# Patient Record
Sex: Female | Born: 1995 | Race: White | Hispanic: No | Marital: Single | Smoking: Never smoker
Health system: Southern US, Community
[De-identification: ages and names within clinical notes are randomized; demographics above are authoritative.]

## PROBLEM LIST (undated history)

## (undated) ENCOUNTER — Emergency Department (HOSPITAL_COMMUNITY): Payer: BC Managed Care – PPO

## (undated) DIAGNOSIS — F32A Depression, unspecified: Secondary | ICD-10-CM

## (undated) DIAGNOSIS — B001 Herpesviral vesicular dermatitis: Secondary | ICD-10-CM

## (undated) DIAGNOSIS — F419 Anxiety disorder, unspecified: Secondary | ICD-10-CM

## (undated) HISTORY — DX: Herpesviral vesicular dermatitis: B00.1

---
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
## 2020-01-14 NOTE — L&D Delivery Note (Addendum)
Delivery Note Shortly after receiving her 2nd epidural, she was noted to be C/C/+3, not feeling any pressure. At 9:56 PM a viable female was delivered via Vaginal, Spontaneous (Presentation: Right Occiput Anterior, loose nuchal, reduced).  APGAR: 9, 9; weight pending. After 1 minute, the cord was clamped and cut. 40 units of pitocin diluted in 1000cc LR was infused rapidly IV.  The placenta separated spontaneously and delivered via CCT and maternal pushing effort.  It was inspected and appears to be intact with a 3 VC.  Anesthesia: Epidural Episiotomy: None Lacerations: None Suture Repair:  Est. Blood Loss (mL):  100  Mom to postpartum.  Baby to Couplet care / Skin to Skin.  Jacklyn Shell 12/13/2020, 10:32 PM

## 2020-05-17 ENCOUNTER — Other Ambulatory Visit: Payer: Self-pay

## 2020-05-17 ENCOUNTER — Ambulatory Visit (INDEPENDENT_AMBULATORY_CARE_PROVIDER_SITE_OTHER): Payer: BC Managed Care – PPO

## 2020-05-17 VITALS — BP 136/78 | HR 72

## 2020-05-17 DIAGNOSIS — Z3201 Encounter for pregnancy test, result positive: Secondary | ICD-10-CM

## 2020-05-17 DIAGNOSIS — Z3200 Encounter for pregnancy test, result unknown: Secondary | ICD-10-CM

## 2020-05-17 LAB — POCT URINE PREGNANCY: Preg Test, Ur: POSITIVE — AB

## 2020-05-17 NOTE — Progress Notes (Signed)
[name] presents today for UPT. She does not complain of any   unusual complaints. LMP:03/13/2020    OBJECTIVE: Appears well, in no apparent distress.  OB History   No obstetric history on file.    Home UPT Result: positive  In-Office UPT result:positive  I have reviewed the patient's medical, obstetrical, social, and family histories, and medications.   ASSESSMENT: Positive pregnancy test  PLAN Prenatal care to be completed at: Follow up with [facility] for prenatal care.

## 2020-06-06 ENCOUNTER — Other Ambulatory Visit: Payer: Self-pay

## 2020-06-06 ENCOUNTER — Ambulatory Visit (INDEPENDENT_AMBULATORY_CARE_PROVIDER_SITE_OTHER): Payer: BC Managed Care – PPO

## 2020-06-06 VITALS — BP 130/85 | HR 89 | Ht 64.0 in | Wt 146.6 lb

## 2020-06-06 DIAGNOSIS — Z3481 Encounter for supervision of other normal pregnancy, first trimester: Secondary | ICD-10-CM

## 2020-06-06 DIAGNOSIS — Z3A11 11 weeks gestation of pregnancy: Secondary | ICD-10-CM

## 2020-06-06 DIAGNOSIS — O3680X0 Pregnancy with inconclusive fetal viability, not applicable or unspecified: Secondary | ICD-10-CM

## 2020-06-06 DIAGNOSIS — Z3491 Encounter for supervision of normal pregnancy, unspecified, first trimester: Secondary | ICD-10-CM | POA: Insufficient documentation

## 2020-06-06 DIAGNOSIS — O219 Vomiting of pregnancy, unspecified: Secondary | ICD-10-CM

## 2020-06-06 MED ORDER — BLOOD PRESSURE KIT DEVI: 1.0000 | 0 refills | Status: DC

## 2020-06-06 MED ORDER — DICLEGIS 10-10 MG PO TBEC: 2.0000 | DELAYED_RELEASE_TABLET | Freq: Every day | ORAL | 2 refills | Status: DC

## 2020-06-06 NOTE — Progress Notes (Signed)
New OB Intake  I connected with  Kelsey Dougherty on 06/06/20 at  3:15 PM EDT by in office and verified that I am speaking with the correct person using two identifiers. Nurse is located at [facility] and pt is located at in office.  I discussed the limitations, risks, security and privacy concerns of performing an evaluation and management service by telephone and the availability of in person appointments. I also discussed with the patient that there may be a patient responsible charge related to this service. The patient expressed understanding and agreed to proceed.  I explained I am completing New OB Intake today. We discussed her EDD of 12/21/20 that is based on LMP of 03/16/20. Pt is G2/P1. I reviewed her allergies, medications, Medical/Surgical/OB history, and appropriate screenings. I informed her of [facility] services. Based on history, this is a/an uncomplicated pregnancy.  There are no problems to display for this patient.   Concerns addressed today  Delivery Plans:  Plans to deliver at [facility].   MyChart/Babyscripts MyChart access verified. I explained pt will have some visits in office and some virtually. Babyscripts instructions given and order placed. Patient verifies receipt of registration text/e-mail. Account successfully created and app downloaded.  Blood Pressure Cuff Blood pressure cuff ordered for patient to pick-up from [employer]. Explained after first prenatal appt pt will check weekly and document in Babyscripts.  Anatomy US Explained first scheduled [place] will be around 19 weeks. Dating and Viability Scan performed today  Labs Discussed Avelina Laine genetic screening with patient. Would like both Panorama and Horizon drawn at new OB visit. Routine prenatal labs needed.  Covid Vaccine Patient has not covid vaccine.   Social Determinants of Health . Food Insecurity: Patient denies food insecurity. . WIC Referral: Patient is interested in referral to [facility].  . Transportation:  Patient denies transportation needs. . Childcare: Discussed no children allowed at ultrasound appointments. Offered childcare services; patient declines childcare services at this time.  First visit review I reviewed new OB appt with pt. I explained she will have a pelvic exam, ob bloodwork with genetic screening, and PAP smear. Explained pt will be seen by Venia Carbon at first visit; encounter routed to appropriate provider. Explained that patient will be seen by pregnancy navigator following visit with provider.  Hamilton Capri, RN 06/06/2020  3:10 PM

## 2020-06-06 NOTE — Progress Notes (Signed)
Agree with A & P. 

## 2020-06-12 ENCOUNTER — Other Ambulatory Visit (HOSPITAL_COMMUNITY)
Admission: RE | Admit: 2020-06-12 | Discharge: 2020-06-12 | Disposition: A | Discharge status: Home / Self Care | Payer: BC Managed Care – PPO | Source: Ambulatory Visit | Attending: Obstetrics and Gynecology | Admitting: Obstetrics and Gynecology

## 2020-06-12 ENCOUNTER — Other Ambulatory Visit: Payer: Self-pay

## 2020-06-12 ENCOUNTER — Ambulatory Visit (INDEPENDENT_AMBULATORY_CARE_PROVIDER_SITE_OTHER): Payer: BC Managed Care – PPO | Admitting: Obstetrics and Gynecology

## 2020-06-12 DIAGNOSIS — Z3481 Encounter for supervision of other normal pregnancy, first trimester: Secondary | ICD-10-CM

## 2020-06-12 NOTE — Progress Notes (Signed)
History:   Kelsey Dougherty is a 25 y.o. G2P1001 at 20w4dby LMP being seen today for her first obstetrical visit.  Her obstetrical history is significant for healthy female . Patient does intend to breast feed. Pregnancy history fully reviewed.  [place] and [place] may be moving.   Patient reports no complaints.      HISTORY: OB History  Gravida Para Term Preterm AB Living  '2 1 1 ' 0 0 1  SAB IAB Ectopic Multiple Live Births  0 0 0 0 1    # Outcome Date GA Lbr Len/2nd Weight Sex Delivery Anes PTL Lv  2 Current           1 Term 08/04/18 381w5d F Vag-Spont   LIV    Last pap smear was done 06/12/20 and was collected today.   No past medical history on file. Past Surgical History:  Procedure Laterality Date  . EYE SURGERY Right 2016   x3  . RHINOPLASTY  2016  . WISDOM TOOTH EXTRACTION  2017   Family History  Problem Relation Age of Onset  . Hypertension Mother   . Skin cancer Father    Social History   Tobacco Use  . Smoking status: Never Smoker  . Smokeless tobacco: Never Used  Vaping Use  . Vaping Use: Never used  Substance Use Topics  . Alcohol use: Not Currently    Comment: occ, not since confirmed preg  . Drug use: Not Currently   Not on File Current Outpatient Medications on File Prior to Visit  Medication Sig Dispense Refill  . Prenatal MV & Min w/FA-DHA (PRENATAL GUMMIES PO) Take by mouth.    . Marland Kitchencyclovir (ZOVIRAX) 400 MG tablet Take 400 mg by mouth 3 (three) times daily. (Patient not taking: Reported on 06/12/2020)    . Blood Pressure Monitoring (BLOOD PRESSURE KIT) DEVI 1 kit by Does not apply route once a week. (Patient not taking: Reported on 06/12/2020) 1 each 0  . DICLEGIS 10-10 MG TBEC Take 2 tablets by mouth at bedtime. If symptoms persist, add one tablet in the morning and one in the afternoon (Patient not taking: Reported on 06/12/2020) 100 tablet 2   No current facility-administered medications on  file prior to visit.    Review of Systems Pertinent items noted in HPI and remainder of comprehensive ROS otherwise negative.  Physical Exam:   Vitals:   06/12/20 1348  BP: 124/83  Pulse: 82  Weight: 145 lb 9.6 oz (66 kg)   Fetal Heart Rate (bpm): 162  General: well-developed, well-nourished female in no acute distress  Breasts:  normal appearance, no masses or tenderness bilaterally  Skin: normal coloration and turgor, no rashes  Neurologic: oriented, normal, negative, normal mood  Extremities: normal strength, tone, and muscle mass, ROM of all joints is normal  HEENT PERRLA, extraocular movement intact and sclera clear, anicteric  Neck supple and no masses  Cardiovascular: regular rate and rhythm  Respiratory:  no respiratory distress, normal breath sounds  Abdomen: soft, non-tender; bowel sounds normal; no masses,  no organomegaly  Pelvic: normal external genitalia, no lesions, normal vaginal mucosa, normal vaginal discharge, normal cervix, pap smear done. Uterine size:  deferred     Assessment:    Pregnancy: G2P1001 Patient Active Problem List   Diagnosis Date Noted  . Encounter for supervision of normal pregnancy in first trimester 06/06/2020     Plan:   1. Encounter for supervision  of other normal pregnancy in first trimester  - Cervicovaginal ancillary only( [place]) - Babyscripts Schedule Optimization - Culture, OB Urine - Obstetric Panel, Including HIV - Genetic Screening - Hep C Antibody - Cytology - PAP( [place]) - Hemoglobin A1c   Initial labs drawn. Continue prenatal vitamins. Problem list reviewed and updated. Genetic Screening discussed, NIPS: requested. Ultrasound discussed; fetal anatomic survey: requested. Anticipatory guidance about prenatal visits given including labs, ultrasounds, and testing. Discussed usage of Babyscripts and virtual visits as additional source of managing and completing prenatal visits in midst of coronavirus and  pandemic.   Encouraged to complete MyChart Registration for her ability to review results, send requests, and have questions addressed.  The nature of [place] for [facility]ulty [facility] with multiple MDs and Advanced Practice Providers was explained to patient; also emphasized that residents, students are part of our team. Routine obstetric precautions reviewed. Encouraged to seek out care at office or emergency room [facility] MAU preferred) for urgent and/or emergent concerns. No follow-ups on file.     Chrisa Hassan, Artist Pais, [place] for [employer], [place]

## 2020-06-13 DIAGNOSIS — Z419 Encounter for procedure for purposes other than remedying health state, unspecified: Secondary | ICD-10-CM | POA: Diagnosis not present

## 2020-06-13 LAB — CERVICOVAGINAL ANCILLARY ONLY
Chlamydia: NEGATIVE
Comment: NEGATIVE
Comment: NEGATIVE
Comment: NORMAL
Neisseria Gonorrhea: NEGATIVE
Trichomonas: NEGATIVE

## 2020-06-13 LAB — OBSTETRIC PANEL, INCLUDING HIV
Antibody Screen: NEGATIVE
Basophils Absolute: 0 10*3/uL (ref 0.0–0.2)
Basos: 0 %
EOS (ABSOLUTE): 0.1 10*3/uL (ref 0.0–0.4)
Eos: 2 %
HIV Screen 4th Generation wRfx: NONREACTIVE
Hematocrit: 39.3 % (ref 34.0–46.6)
Hemoglobin: 13 g/dL (ref 11.1–15.9)
Hepatitis B Surface Ag: NEGATIVE
Immature Grans (Abs): 0 10*3/uL (ref 0.0–0.1)
Immature Granulocytes: 0 %
Lymphocytes Absolute: 1.7 10*3/uL (ref 0.7–3.1)
Lymphs: 22 %
MCH: 30 pg (ref 26.6–33.0)
MCHC: 33.1 g/dL (ref 31.5–35.7)
MCV: 91 fL (ref 79–97)
Monocytes Absolute: 0.4 10*3/uL (ref 0.1–0.9)
Monocytes: 6 %
Neutrophils Absolute: 5.1 10*3/uL (ref 1.4–7.0)
Neutrophils: 70 %
Platelets: 210 10*3/uL (ref 150–450)
RBC: 4.33 x10E6/uL (ref 3.77–5.28)
RDW: 12.4 % (ref 11.7–15.4)
RPR Ser Ql: NONREACTIVE
Rh Factor: POSITIVE
Rubella Antibodies, IGG: 2.95 index (ref >0.99)
WBC: 7.4 10*3/uL (ref 3.4–10.8)

## 2020-06-13 LAB — HEPATITIS C ANTIBODY: Hep C Virus Ab: <0.1 s/co ratio (ref 0.0–0.9)

## 2020-06-13 LAB — HEMOGLOBIN A1C
Est. average glucose Bld gHb Est-mCnc: 91 mg/dL
Hgb A1c MFr Bld: 4.8 % (ref 4.8–5.6)

## 2020-06-14 DIAGNOSIS — Z3480 Encounter for supervision of other normal pregnancy, unspecified trimester: Secondary | ICD-10-CM | POA: Diagnosis not present

## 2020-06-14 LAB — URINE CULTURE, OB REFLEX

## 2020-06-14 LAB — CULTURE, OB URINE

## 2020-06-18 ENCOUNTER — Encounter: Payer: Self-pay | Admitting: Obstetrics and Gynecology

## 2020-06-18 LAB — CYTOLOGY - PAP
Comment: NEGATIVE
Comment: NEGATIVE
Diagnosis: NEGATIVE
HPV 16: NEGATIVE
HPV 18 / 45: NEGATIVE
High risk HPV: POSITIVE — AB

## 2020-06-20 ENCOUNTER — Telehealth: Payer: Self-pay

## 2020-06-20 NOTE — Telephone Encounter (Signed)
Patient fetal sex was not check on the natera form.She would like this to be added on the testing.Message sent to [facility] to add on fetal sex.

## 2020-06-21 ENCOUNTER — Other Ambulatory Visit: Payer: Self-pay

## 2020-07-10 ENCOUNTER — Ambulatory Visit (INDEPENDENT_AMBULATORY_CARE_PROVIDER_SITE_OTHER): Payer: BC Managed Care – PPO | Admitting: Advanced Practice Midwife

## 2020-07-10 ENCOUNTER — Other Ambulatory Visit: Payer: Self-pay

## 2020-07-10 VITALS — BP 117/72 | HR 78 | Wt 144.0 lb

## 2020-07-10 DIAGNOSIS — Z3481 Encounter for supervision of other normal pregnancy, first trimester: Secondary | ICD-10-CM

## 2020-07-10 DIAGNOSIS — Z3A16 16 weeks gestation of pregnancy: Secondary | ICD-10-CM

## 2020-07-10 DIAGNOSIS — M792 Neuralgia and neuritis, unspecified: Secondary | ICD-10-CM

## 2020-07-10 NOTE — Progress Notes (Signed)
   PRENATAL VISIT NOTE  Subjective:  Kelsey Dougherty is a 25 y.o. G2P1001 at [redacted]w[redacted]d being seen today for ongoing prenatal care.  She is currently monitored for the following issues for this low-risk pregnancy and has Encounter for supervision of normal pregnancy in first trimester on their problem list.  Patient reports no complaints.  Contractions: Not present. Vag. Bleeding: None.   . Denies leaking of fluid.   The following portions of the patient's history were reviewed and updated as appropriate: allergies, current medications, past family history, past medical history, past social history, past surgical history and problem list.   Objective:   Vitals:   07/10/20 1419  BP: 117/72  Pulse: 78  Weight: 144 lb (65.3 kg)    Fetal Status: Fetal Heart Rate (bpm): 146         General:  Alert, oriented and cooperative. Patient is in no acute distress.  Skin: Skin is warm and dry. No rash noted.   Cardiovascular: Normal heart rate noted  Respiratory: Normal respiratory effort, no problems with respiration noted  Abdomen: Soft, gravid, appropriate for gestational age.  Pain/Pressure: Absent     Pelvic: Cervical exam deferred        Extremities: Normal range of motion.  Edema: None  Mental Status: Normal mood and affect. Normal behavior. Normal judgment and thought content.   Assessment and Plan:  Pregnancy: G2P1001 at [redacted]w[redacted]d 1. Encounter for supervision of other normal pregnancy in first trimester --Anticipatory guidance about next visits/weeks of pregnancy given. --Next appt in 4 weeks --Anatomy US ordered - AFP, Serum, Open Spina Bifida  2. [redacted] weeks gestation of pregnancy   3. Nerve pain --Pt with episodes of pain in her forearms and her feet that are burning and sharp in nature.  They occurred prior to pregnancy but have been worse in the pregnancy. Not c/w sciatica or carpal tunnel. --Pt to f/u with neurology during pregnancy and/or postpartum as needed - Ambulatory referral to  Neurology   Preterm labor symptoms and general obstetric precautions including but not limited to vaginal bleeding, contractions, leaking of fluid and fetal movement were reviewed in detail with the patient. Please refer to After Visit Summary for other counseling recommendations.   Return in about 4 weeks (around 08/07/2020).  Future Appointments  Date Time Provider Department Center  07/27/2020  2:15 PM [facility]2 [facility]  08/06/2020  2:20 PM Leftwich-Kirby, Wilmer Floor, CNM [facility] None    Sharen Counter, CNM

## 2020-07-10 NOTE — Patient Instructions (Signed)

## 2020-07-12 LAB — AFP, SERUM, OPEN SPINA BIFIDA
AFP MoM: 1.24
AFP Value: 48 ng/mL
Gest. Age on Collection Date: 16.6 weeks
Maternal Age At EDD: 23.3 yr
OSBR Risk 1 IN: 5748
Test Results:: NEGATIVE
Weight: 144 [lb_av]

## 2020-07-13 DIAGNOSIS — Z419 Encounter for procedure for purposes other than remedying health state, unspecified: Secondary | ICD-10-CM | POA: Diagnosis not present

## 2020-07-27 ENCOUNTER — Ambulatory Visit: Payer: BC Managed Care – PPO | Attending: Obstetrics and Gynecology

## 2020-07-27 ENCOUNTER — Other Ambulatory Visit: Payer: Self-pay

## 2020-07-27 DIAGNOSIS — Z3481 Encounter for supervision of other normal pregnancy, first trimester: Secondary | ICD-10-CM | POA: Diagnosis not present

## 2020-08-06 ENCOUNTER — Other Ambulatory Visit: Payer: Self-pay

## 2020-08-06 ENCOUNTER — Ambulatory Visit (INDEPENDENT_AMBULATORY_CARE_PROVIDER_SITE_OTHER): Payer: BC Managed Care – PPO | Admitting: Advanced Practice Midwife

## 2020-08-06 VITALS — BP 120/75 | HR 90 | Wt 146.8 lb

## 2020-08-06 DIAGNOSIS — Z3402 Encounter for supervision of normal first pregnancy, second trimester: Secondary | ICD-10-CM

## 2020-08-06 DIAGNOSIS — Z3A20 20 weeks gestation of pregnancy: Secondary | ICD-10-CM

## 2020-08-06 DIAGNOSIS — O2612 Low weight gain in pregnancy, second trimester: Secondary | ICD-10-CM | POA: Insufficient documentation

## 2020-08-06 NOTE — Progress Notes (Signed)
   PRENATAL VISIT NOTE  Subjective:  Kelsey Dougherty is a 25 y.o. G2P1001 at [redacted]w[redacted]d being seen today for ongoing prenatal care.  She is currently monitored for the following issues for this low-risk pregnancy and has Encounter for supervision of normal pregnancy in first trimester on their problem list.  Patient reports no complaints.  Contractions: Not present. Vag. Bleeding: None.  Movement: Present. Denies leaking of fluid.   The following portions of the patient's history were reviewed and updated as appropriate: allergies, current medications, past family history, past medical history, past social history, past surgical history and problem list.   Objective:   Vitals:   08/06/20 1408  BP: 120/75  Pulse: 90  Weight: 146 lb 12.8 oz (66.6 kg)    Fetal Status: Fetal Heart Rate (bpm): 147   Movement: Present     General:  Alert, oriented and cooperative. Patient is in no acute distress.  Skin: Skin is warm and dry. No rash noted.   Cardiovascular: Normal heart rate noted  Respiratory: Normal respiratory effort, no problems with respiration noted  Abdomen: Soft, gravid, appropriate for gestational age.  Pain/Pressure: Absent     Pelvic: Cervical exam deferred        Extremities: Normal range of motion.  Edema: None  Mental Status: Normal mood and affect. Normal behavior. Normal judgment and thought content.   Assessment and Plan:  Pregnancy: G2P1001 at [redacted]w[redacted]d 1. [redacted] weeks gestation of pregnancy   2. Poor weight gain of pregnancy, second trimester --Pt reports she was 138 lbs when she stopped breastfeeding her daughter and then she found out she was pregnant 2-3 weeks later. The first weight we have after that was 145 lbs, and she has been 145 each visit so no weight gain is recorded.  She reports good appetite now and is eating regularly. --Will watch, discussed ways to increase calories including protein shakes between meals, high protein foods.  3. Encounter for supervision of normal  first pregnancy in second trimester --Anticipatory guidance about next visits/weeks of pregnancy given. --Next visit in 4 weeks    Preterm labor symptoms and general obstetric precautions including but not limited to vaginal bleeding, contractions, leaking of fluid and fetal movement were reviewed in detail with the patient. Please refer to After Visit Summary for other counseling recommendations.   No follow-ups on file.  Future Appointments  Date Time Provider Department Center  10/23/2020  3:00 PM Penumalli, Glenford Bayley, MD [facility] None    Sharen Counter, CNM

## 2020-08-09 ENCOUNTER — Encounter (HOSPITAL_COMMUNITY): Payer: Self-pay | Admitting: Family Medicine

## 2020-08-09 ENCOUNTER — Inpatient Hospital Stay (HOSPITAL_COMMUNITY)
Admission: AD | Admit: 2020-08-09 | Discharge: 2020-08-10 | Disposition: A | Discharge status: Home / Self Care | Payer: BC Managed Care – PPO | Attending: Family Medicine | Admitting: Family Medicine

## 2020-08-09 DIAGNOSIS — O98512 Other viral diseases complicating pregnancy, second trimester: Secondary | ICD-10-CM | POA: Insufficient documentation

## 2020-08-09 DIAGNOSIS — Z3A21 21 weeks gestation of pregnancy: Secondary | ICD-10-CM

## 2020-08-09 DIAGNOSIS — R52 Pain, unspecified: Secondary | ICD-10-CM

## 2020-08-09 DIAGNOSIS — R102 Pelvic and perineal pain: Secondary | ICD-10-CM | POA: Diagnosis not present

## 2020-08-09 DIAGNOSIS — O26892 Other specified pregnancy related conditions, second trimester: Secondary | ICD-10-CM | POA: Insufficient documentation

## 2020-08-09 DIAGNOSIS — U071 COVID-19: Secondary | ICD-10-CM | POA: Diagnosis not present

## 2020-08-09 DIAGNOSIS — R519 Headache, unspecified: Secondary | ICD-10-CM

## 2020-08-09 HISTORY — DX: Depression, unspecified: F32.A

## 2020-08-09 HISTORY — DX: Anxiety disorder, unspecified: F41.9

## 2020-08-09 NOTE — MAU Note (Signed)
Back started hurting earlier today and then aching all over. Took tylenol at 1830 ([address]) and helped but then started having aching. Vomiting in Triage. Mild abd cramping but thinks due to constipation. Had small BM today.  No SOB now or chest pain.

## 2020-08-10 ENCOUNTER — Inpatient Hospital Stay (EMERGENCY_DEPARTMENT_HOSPITAL)
Admission: AD | Admit: 2020-08-10 | Discharge: 2020-08-10 | Disposition: A | Discharge status: Home / Self Care | Payer: BC Managed Care – PPO | Source: Home / Self Care | Attending: Obstetrics and Gynecology | Admitting: Obstetrics and Gynecology

## 2020-08-10 ENCOUNTER — Other Ambulatory Visit: Payer: Self-pay

## 2020-08-10 ENCOUNTER — Encounter (HOSPITAL_COMMUNITY): Payer: Self-pay | Admitting: Obstetrics and Gynecology

## 2020-08-10 DIAGNOSIS — R102 Pelvic and perineal pain: Secondary | ICD-10-CM

## 2020-08-10 DIAGNOSIS — Z3A21 21 weeks gestation of pregnancy: Secondary | ICD-10-CM

## 2020-08-10 DIAGNOSIS — O98512 Other viral diseases complicating pregnancy, second trimester: Secondary | ICD-10-CM

## 2020-08-10 DIAGNOSIS — U071 COVID-19: Secondary | ICD-10-CM

## 2020-08-10 DIAGNOSIS — O26892 Other specified pregnancy related conditions, second trimester: Secondary | ICD-10-CM

## 2020-08-10 LAB — URINALYSIS, ROUTINE W REFLEX MICROSCOPIC
Bilirubin Urine: NEGATIVE
Bilirubin Urine: NEGATIVE
Glucose, UA: NEGATIVE mg/dL
Glucose, UA: NEGATIVE mg/dL
Hgb urine dipstick: NEGATIVE
Hgb urine dipstick: NEGATIVE
Ketones, ur: 80 mg/dL — AB
Ketones, ur: NEGATIVE mg/dL
Leukocytes,Ua: NEGATIVE
Nitrite: NEGATIVE
Nitrite: NEGATIVE
Protein, ur: 30 mg/dL — AB
Protein, ur: NEGATIVE mg/dL
Specific Gravity, Urine: 1.015 (ref 1.005–1.030)
Specific Gravity, Urine: 1.024 (ref 1.005–1.030)
pH: 5 (ref 5.0–8.0)
pH: 9 — ABNORMAL HIGH (ref 5.0–8.0)

## 2020-08-10 LAB — BASIC METABOLIC PANEL
Anion gap: 8 (ref 5–15)
BUN: 6 mg/dL (ref 6–20)
CO2: 20 mmol/L — ABNORMAL LOW (ref 22–32)
Calcium: 8.3 mg/dL — ABNORMAL LOW (ref 8.9–10.3)
Chloride: 107 mmol/L (ref 98–111)
Creatinine, Ser: 0.66 mg/dL (ref 0.44–1.00)
GFR, Estimated: >60 mL/min (ref >60)
Glucose, Bld: 109 mg/dL — ABNORMAL HIGH (ref 70–99)
Potassium: 3.3 mmol/L — ABNORMAL LOW (ref 3.5–5.1)
Sodium: 135 mmol/L (ref 135–145)

## 2020-08-10 LAB — RESP PANEL BY RT-PCR (FLU A&B, COVID) ARPGX2
Influenza A by PCR: NEGATIVE
Influenza B by PCR: NEGATIVE
SARS Coronavirus 2 by RT PCR: POSITIVE — AB

## 2020-08-10 MED ORDER — OXYCODONE-ACETAMINOPHEN 5-325 MG PO TABS: 1.0000 | ORAL_TABLET | Freq: Four times a day (QID) | ORAL | 0 refills | Status: DC | PRN

## 2020-08-10 MED ORDER — NIRMATRELVIR/RITONAVIR (PAXLOVID)TABLET
3.0000 | ORAL_TABLET | Freq: Two times a day (BID) | ORAL | Status: DC
  Administered 2020-08-10: 3 via ORAL
  Filled 2020-08-10: qty 30

## 2020-08-10 MED ORDER — HYDROMORPHONE HCL 1 MG/ML IJ SOLN
1.0000 mg | Freq: Once | INTRAMUSCULAR | Status: AC
  Administered 2020-08-10: 1 mg via INTRAMUSCULAR
  Filled 2020-08-10: qty 1

## 2020-08-10 MED ORDER — LACTATED RINGERS IV BOLUS
1000.0000 mL | Freq: Once | INTRAVENOUS | Status: AC
  Administered 2020-08-10: 1000 mL via INTRAVENOUS

## 2020-08-10 MED ORDER — ONDANSETRON 4 MG PO TBDP: 4.0000 mg | ORAL_TABLET | Freq: Three times a day (TID) | ORAL | 0 refills | Status: DC | PRN

## 2020-08-10 MED ORDER — PROMETHAZINE HCL 25 MG PO TABS
25.0000 mg | ORAL_TABLET | Freq: Once | ORAL | Status: AC
  Administered 2020-08-10: 25 mg via ORAL
  Filled 2020-08-10: qty 1

## 2020-08-10 MED ORDER — PROMETHAZINE HCL 25 MG PO TABS: 25.0000 mg | ORAL_TABLET | Freq: Four times a day (QID) | ORAL | 0 refills | Status: DC | PRN

## 2020-08-10 MED ORDER — TRAMADOL HCL 50 MG PO TABS
100.0000 mg | ORAL_TABLET | Freq: Once | ORAL | Status: AC
  Administered 2020-08-10: 100 mg via ORAL
  Filled 2020-08-10: qty 2

## 2020-08-10 NOTE — MAU Note (Signed)
Pt reports she was seen here last pm and tested positive for covid last night and today she is having " a little bit of pelvic pressure" and she has vomited x 2 today. States she can't keep her meds down.

## 2020-08-10 NOTE — MAU Provider Note (Signed)
History     CSN: [id number]  Arrival date and time: 08/10/20 1318   Event Date/Time   First Provider Initiated Contact with Patient 08/10/20 1528      Chief Complaint  Patient presents with   Pelvic Pain   Emesis   HPI Kelsey Dougherty is a 25 y.o. G2P1001 at 34w0dwho presents with pelvic pain. She was seen in MAU last night and diagnosed with COVID. She was discharged home at 0400 with Paxlovid and percocet. She states she has continued vomiting since discharge. She reports the pelvic pain is intermittent and worse when she sits in the same position. She denies any bleeding or leaking. She reports feeling fetal movement. She also has questions about her labs results and wants to review the labs from last night.   OB History     Gravida  2   Para  1   Term  1   Preterm      AB      Living  1      SAB      IAB      Ectopic      Multiple      Live Births  1           Past Medical History:  Diagnosis Date   Anxiety    Depression     Past Surgical History:  Procedure Laterality Date   EYE SURGERY Right 2016   x3   RHINOPLASTY  2016   WISDOM TOOTH EXTRACTION  2017    Family History  Problem Relation Age of Onset   Hypertension Mother    Skin cancer Father     Social History   Tobacco Use   Smoking status: Never   Smokeless tobacco: Never  Vaping Use   Vaping Use: Never used  Substance Use Topics   Alcohol use: Not Currently    Comment: occ, not since confirmed preg   Drug use: Not Currently    Allergies: No Known Allergies  Medications Prior to Admission  Medication Sig Dispense Refill Last Dose   Blood Pressure Monitoring (BLOOD PRESSURE KIT) DEVI 1 kit by Does not apply route once a week. 1 each 0    DICLEGIS 10-10 MG TBEC Take 2 tablets by mouth at bedtime. If symptoms persist, add one tablet in the morning and one in the afternoon 100 tablet 2    oxyCODONE-acetaminophen (PERCOCET/ROXICET) 5-325 MG tablet Take 1-2 tablets by mouth  every 6 (six) hours as needed for severe pain. 10 tablet 0    Prenatal MV & Min w/FA-DHA (PRENATAL GUMMIES PO) Take by mouth.       Review of Systems  Constitutional: Negative.  Negative for fatigue and fever.  HENT: Negative.    Respiratory: Negative.  Negative for shortness of breath.   Cardiovascular: Negative.  Negative for chest pain.  Gastrointestinal:  Positive for nausea and vomiting. Negative for abdominal pain, constipation and diarrhea.  Genitourinary:  Positive for pelvic pain. Negative for dysuria, vaginal bleeding and vaginal discharge.  Neurological: Negative.  Negative for dizziness and headaches.  Physical Exam   Blood pressure 110/67, pulse 91, temperature 98.3 F (36.8 C), resp. rate 16, last menstrual period 03/16/2020, SpO2 99 %.  Physical Exam Vitals and nursing note reviewed.  Constitutional:      General: She is not in acute distress.    Appearance: She is well-developed.  HENT:     Head: Normocephalic.  Eyes:     Pupils: Pupils are  equal, round, and reactive to light.  Cardiovascular:     Rate and Rhythm: Normal rate and regular rhythm.     Heart sounds: Normal heart sounds.  Pulmonary:     Effort: Pulmonary effort is normal. No respiratory distress.     Breath sounds: Normal breath sounds.  Abdominal:     General: Bowel sounds are normal. There is no distension.     Palpations: Abdomen is soft.     Tenderness: There is no abdominal tenderness.  Skin:    General: Skin is warm and dry.  Neurological:     Mental Status: She is alert and oriented to person, place, and time.  Psychiatric:        Mood and Affect: Mood normal.        Behavior: Behavior normal.        Thought Content: Thought content normal.        Judgment: Judgment normal.   FHT: 156 bpm  MAU Course  Procedures Results for orders placed or performed during the hospital encounter of 08/10/20 (from the past 24 hour(s))  Urinalysis, Routine w reflex microscopic Urine, Clean Catch      Status: Abnormal   Collection Time: 08/10/20  1:35 PM  Result Value Ref Range   Color, Urine YELLOW YELLOW   APPearance HAZY (A) CLEAR   Specific Gravity, Urine 1.024 1.005 - 1.030   pH 5.0 5.0 - 8.0   Glucose, UA NEGATIVE NEGATIVE mg/dL   Hgb urine dipstick NEGATIVE NEGATIVE   Bilirubin Urine NEGATIVE NEGATIVE   Ketones, ur 80 (A) NEGATIVE mg/dL   Protein, ur 30 (A) NEGATIVE mg/dL   Nitrite NEGATIVE NEGATIVE   Leukocytes,Ua NEGATIVE NEGATIVE   RBC / HPF 0-5 0 - 5 RBC/hpf   WBC, UA 6-10 0 - 5 WBC/hpf   Bacteria, UA RARE (A) NONE SEEN   Squamous Epithelial / LPF 6-10 0 - 5   Mucus PRESENT     MDM UA Phenergan PO No episodes of vomiting while in MAU, able to tolerate PO medication  Discussed progression of COVID and expectations for symptom management. Discussed prescribing antiemetics for management of vomiting.   Assessment and Plan   1. Pelvic pain affecting pregnancy in second trimester, antepartum   2. [redacted] weeks gestation of pregnancy   3. COVID-19 affecting pregnancy in second trimester    -Discharge home in stable condition -Rx for phenergan and zofran sent to patient's pharmacy -COVID precautions discussed -Patient advised to follow-up with OB as scheduled for prenatal care -Patient may return to MAU as needed or if her condition were to change or worsen   Wende Mott CNM 08/10/2020, 3:28 PM

## 2020-08-10 NOTE — MAU Provider Note (Signed)
Chief Complaint: Generalized Body Aches and Fever   Event Date/Time   First Provider Initiated Contact with Patient 08/10/20 0013      SUBJECTIVE HPI: Kelsey Dougherty is a 25 y.o. G2P1001 at 19w0dwho presents to maternity admissions reporting chills, body aches and headache. Symptoms started earlier today on 08/09/20. She reports feeling back pain, then full body aches and chills and tried a hot bath and Tylenol that helped for 2-3 hours but the symptoms returned.  She reports vomiting x 1 and diarrhea x 1. She denies sick contacts. There is no cramping abdominal pain or other pregnancy symptoms.    Location: body aches Quality: aching Severity: 8/10 on pain scale Duration: 1 day Timing: constant Modifying factors: Improved for 2-3 hours with Tylenol, but then worsened Associated signs and symptoms: headache, mild n/v/d  HPI  Past Medical History:  Diagnosis Date   Anxiety    Depression    Past Surgical History:  Procedure Laterality Date   EYE SURGERY Right 2016   x3   RHINOPLASTY  2016   WISDOM TOOTH EXTRACTION  2017   Social History   Socioeconomic History   Marital status: Single    Spouse name: Not on file   Number of children: Not on file   Years of education: Not on file   Highest education level: Not on file  Occupational History   Not on file  Tobacco Use   Smoking status: Never   Smokeless tobacco: Never  Vaping Use   Vaping Use: Never used  Substance and Sexual Activity   Alcohol use: Not Currently    Comment: occ, not since confirmed preg   Drug use: Not Currently   Sexual activity: Yes    Partners: Male    Birth control/protection: None  Other Topics Concern   Not on file  Social History Narrative   Not on file   Social Determinants of Health   Financial Resource Strain: Not on file  Food Insecurity: Not on file  Transportation Needs: Not on file  Physical Activity: Not on file  Stress: Not on file  Social Connections: Not on file  Intimate  Partner Violence: Not on file   No current facility-administered medications on file prior to encounter.   Current Outpatient Medications on File Prior to Encounter  Medication Sig Dispense Refill   acyclovir (ZOVIRAX) 400 MG tablet Take 400 mg by mouth 3 (three) times daily.     Prenatal MV & Min w/FA-DHA (PRENATAL GUMMIES PO) Take by mouth.     Blood Pressure Monitoring (BLOOD PRESSURE KIT) DEVI 1 kit by Does not apply route once a week. 1 each 0   DICLEGIS 10-10 MG TBEC Take 2 tablets by mouth at bedtime. If symptoms persist, add one tablet in the morning and one in the afternoon 100 tablet 2   No Known Allergies  ROS:  Review of Systems  Constitutional:  Positive for chills. Negative for fatigue and fever.  Eyes:  Negative for visual disturbance.  Respiratory:  Negative for shortness of breath.   Cardiovascular:  Negative for chest pain.  Gastrointestinal:  Negative for abdominal pain, nausea and vomiting.  Genitourinary:  Negative for difficulty urinating, dysuria, flank pain, pelvic pain, vaginal bleeding, vaginal discharge and vaginal pain.  Musculoskeletal:  Positive for myalgias.  Neurological:  Positive for headaches. Negative for dizziness.  Psychiatric/Behavioral: Negative.      I have reviewed patient's Past Medical Hx, Surgical Hx, Family Hx, Social Hx, medications and allergies.  Physical Exam  Patient Vitals for the past 24 hrs:  BP Temp Temp src Pulse Resp SpO2 Height Weight  08/10/20 0248 111/62 99.1 F (37.3 C) Axillary (!) 107 18 -- -- --  08/10/20 0154 -- (!) 102 F (38.9 C) -- (!) 120 -- 99 % -- --  08/09/20 2332 -- -- -- (!) 135 -- -- -- --  08/09/20 2331 119/75 -- -- -- -- 100 % -- --  08/09/20 2320 -- 99 F (37.2 C) -- -- 20 -- 5' 4" (1.626 m) 67.1 kg   Constitutional: Well-developed, well-nourished female in no acute distress.  Cardiovascular: normal rate Respiratory: normal effort GI: Abd soft, non-tender. Pos BS x 4 MS: Extremities nontender,  no edema, normal ROM Neurologic: Alert and oriented x 4.  GU: Neg CVAT.  PELVIC EXAM: Deferred  FHT 171 by doppler  LAB RESULTS Results for orders placed or performed during the hospital encounter of 08/09/20 (from the past 24 hour(s))  Urinalysis, Routine w reflex microscopic Urine, Clean Catch     Status: Abnormal   Collection Time: 08/09/20 11:40 PM  Result Value Ref Range   Color, Urine YELLOW YELLOW   APPearance CLOUDY (A) CLEAR   Specific Gravity, Urine 1.015 1.005 - 1.030   pH 9.0 (H) 5.0 - 8.0   Glucose, UA NEGATIVE NEGATIVE mg/dL   Hgb urine dipstick NEGATIVE NEGATIVE   Bilirubin Urine NEGATIVE NEGATIVE   Ketones, ur NEGATIVE NEGATIVE mg/dL   Protein, ur NEGATIVE NEGATIVE mg/dL   Nitrite NEGATIVE NEGATIVE   Leukocytes,Ua TRACE (A) NEGATIVE   RBC / HPF 0-5 0 - 5 RBC/hpf   WBC, UA 6-10 0 - 5 WBC/hpf   Bacteria, UA MANY (A) NONE SEEN   Squamous Epithelial / LPF 11-20 0 - 5   Mucus PRESENT   Resp Panel by RT-PCR (Flu A&B, Covid) Nasopharyngeal Swab     Status: Abnormal   Collection Time: 08/09/20 11:47 PM   Specimen: Nasopharyngeal Swab; Nasopharyngeal(NP) swabs in vial transport medium  Result Value Ref Range   SARS Coronavirus 2 by RT PCR POSITIVE (A) NEGATIVE   Influenza A by PCR NEGATIVE NEGATIVE   Influenza B by PCR NEGATIVE NEGATIVE    O/Positive/-- (05/31 1501)  IMAGING  MAU Management/MDM: Orders Placed This Encounter  Procedures   Resp Panel by RT-PCR (Flu A&B, Covid) Nasopharyngeal Swab   Culture, OB Urine   Urinalysis, Routine w reflex microscopic Urine, Clean Catch   Basic metabolic panel   Airborne and Contact precautions   Discharge patient    Meds ordered this encounter  Medications   traMADol (ULTRAM) tablet 100 mg   lactated ringers bolus 1,000 mL   HYDROmorphone (DILAUDID) injection 1 mg   nirmatrelvir/ritonavir EUA (PAXLOVID) TABS 3 tablet   oxyCODONE-acetaminophen (PERCOCET/ROXICET) 5-325 MG tablet    Sig: Take 1-2 tablets by mouth  every 6 (six) hours as needed for severe pain.    Dispense:  10 tablet    Refill:  0    Order Specific Question:   Supervising Provider    Answer:   Donnamae Jude [[id number]]    Pt with body aches/headache unresolved with Tramadol and IV fluids in MAU.  Pain in low back and legs, denies abdominal pain or pelvic pain.  COVID test is positive.  No shortness of breath and normal lung sounds.  Reviewed results with pt, discussed treatment options including Paxlovid.  Rx for Paxlovid, first dose given in MAU per pharmacy and pack sent home with patient to  complete.  BMP ordered in MAU for documentation of GFR. Ok to give healthy patient without renal hx prior to GFR result per pharmacy.  Dilaudid 1 mg IM given for pain. Rx for Percocet 5/325, take 1-2 Q 6 hours x 10 tabs only.  Rx for Flonase to use BID PRN.  Keep scheduled appts at [facility] (none in 10 days).  Return to MAU as needed for worsening symptoms or emergencies.     ASSESSMENT 1. Lab test positive for detection of COVID-19 virus   2. [redacted] weeks gestation of pregnancy   3. Body aches   4. Acute nonintractable headache, unspecified headache type     PLAN Discharge home with COVID/second trimester pregnancy precautions  Allergies as of 08/10/2020   No Known Allergies      Medication List     STOP taking these medications    acyclovir 400 MG tablet Commonly known as: ZOVIRAX       TAKE these medications    Blood Pressure Kit Devi 1 kit by Does not apply route once a week.   Diclegis 10-10 MG Tbec Generic drug: Doxylamine-Pyridoxine Take 2 tablets by mouth at bedtime. If symptoms persist, add one tablet in the morning and one in the afternoon   oxyCODONE-acetaminophen 5-325 MG tablet Commonly known as: PERCOCET/ROXICET Take 1-2 tablets by mouth every 6 (six) hours as needed for severe pain.   PRENATAL GUMMIES PO Take by mouth.        Follow-up Information     [facility] Follow up.   Why: As  scheduled Contact information: [place] [id number] [facility] Follow up.   Specialty: Obstetrics and Gynecology Why: If symptoms worsen, As needed for emergencies Contact information: [address] [phone] [place] Certified Nurse-Midwife 08/10/2020  3:36 AM

## 2020-08-10 NOTE — MAU Note (Signed)
Pt states she does not feel any better after meds and IVFs. Goes between having chills and being hot. Temp now 102

## 2020-08-10 NOTE — Progress Notes (Signed)
Written and verbal d/c instructions given and understanding voiced. 

## 2020-08-10 NOTE — Discharge Instructions (Signed)

## 2020-08-10 NOTE — MAU Note (Signed)
Pt vomited 150cc liquid just before d/c home. D/C by w/c to car where boyfriend drove her home

## 2020-08-11 LAB — CULTURE, OB URINE

## 2020-08-13 DIAGNOSIS — Z419 Encounter for procedure for purposes other than remedying health state, unspecified: Secondary | ICD-10-CM | POA: Diagnosis not present

## 2020-09-03 ENCOUNTER — Encounter: Payer: Self-pay | Admitting: Obstetrics and Gynecology

## 2020-09-03 ENCOUNTER — Ambulatory Visit (INDEPENDENT_AMBULATORY_CARE_PROVIDER_SITE_OTHER): Payer: BC Managed Care – PPO | Admitting: Obstetrics and Gynecology

## 2020-09-03 ENCOUNTER — Other Ambulatory Visit: Payer: Self-pay

## 2020-09-03 VITALS — BP 116/79 | HR 85 | Wt 149.0 lb

## 2020-09-03 DIAGNOSIS — O2612 Low weight gain in pregnancy, second trimester: Secondary | ICD-10-CM

## 2020-09-03 DIAGNOSIS — Z3481 Encounter for supervision of other normal pregnancy, first trimester: Secondary | ICD-10-CM

## 2020-09-03 NOTE — Progress Notes (Signed)
   PRENATAL VISIT NOTE  Subjective:  Kelsey Dougherty is a 25 y.o. G2P1001 at [redacted]w[redacted]d being seen today for ongoing prenatal care.  She is currently monitored for the following issues for this low-risk pregnancy and has Encounter for supervision of normal pregnancy in first trimester and Poor weight gain of pregnancy, second trimester on their problem list.  Patient reports no complaints.  Contractions: Not present. Vag. Bleeding: None.  Movement: Present. Denies leaking of fluid.   The following portions of the patient's history were reviewed and updated as appropriate: allergies, current medications, past family history, past medical history, past social history, past surgical history and problem list.   Objective:   Vitals:   09/03/20 1340  BP: 116/79  Pulse: 85  Weight: 149 lb (67.6 kg)    Fetal Status: Fetal Heart Rate (bpm): 145   Movement: Present     General:  Alert, oriented and cooperative. Patient is in no acute distress.  Skin: Skin is warm and dry. No rash noted.   Cardiovascular: Normal heart rate noted  Respiratory: Normal respiratory effort, no problems with respiration noted  Abdomen: Soft, gravid, appropriate for gestational age.  Pain/Pressure: Absent     Pelvic: Cervical exam deferred        Extremities: Normal range of motion.     Mental Status: Normal mood and affect. Normal behavior. Normal judgment and thought content.   Assessment and Plan:  Pregnancy: G2P1001 at [redacted]w[redacted]d 1. Encounter for supervision of other normal pregnancy in first trimester - GTT/CBC/RPR next visit - Reviewed PNV use - discussed alternatives if still causing nausea - f/u in 4 weeks  2. Poor weight gain of pregnancy, second trimester - Lost most weight in beginning of pregnancy due to n/v but now slowly gaining weight at appropriate amount.  - FH normal for GA - Continue to monitor  Preterm labor symptoms and general obstetric precautions including but not limited to vaginal bleeding,  contractions, leaking of fluid and fetal movement were reviewed in detail with the patient. Please refer to After Visit Summary for other counseling recommendations.   Return in about 4 weeks (around 10/01/2020) for 2 hr GTT, OB VISIT, MD or APP.  Future Appointments  Date Time Provider Department Center  10/29/2020  1:30 PM Windell Norfolk, MD [facility] None    Milas Hock, MD

## 2020-10-01 ENCOUNTER — Other Ambulatory Visit: Payer: Self-pay

## 2020-10-01 ENCOUNTER — Other Ambulatory Visit: Payer: BC Managed Care – PPO

## 2020-10-01 ENCOUNTER — Ambulatory Visit (INDEPENDENT_AMBULATORY_CARE_PROVIDER_SITE_OTHER): Payer: BC Managed Care – PPO | Admitting: Obstetrics and Gynecology

## 2020-10-01 ENCOUNTER — Encounter: Payer: Self-pay | Admitting: Obstetrics and Gynecology

## 2020-10-01 VITALS — BP 114/75 | HR 74 | Wt 152.0 lb

## 2020-10-01 DIAGNOSIS — Z3481 Encounter for supervision of other normal pregnancy, first trimester: Secondary | ICD-10-CM

## 2020-10-01 MED ORDER — DOCUSATE SODIUM 100 MG PO CAPS: 100.0000 mg | ORAL_CAPSULE | Freq: Two times a day (BID) | ORAL | 2 refills | Status: AC | PRN

## 2020-10-01 NOTE — Patient Instructions (Signed)
AREA PEDIATRIC/FAMILY PRACTICE PHYSICIANS  [place] ([address]) [facility] Chambliss, MD; Eniola, MD; Hale, MD; Hensel, MD; McDiarmid, MD; McIntyer, MD; Neal, MD; Walden, MD [address] [place], [place] [address] [phone] Mon-Fri 8:30-12:30, 1:30-5:00 Providers come to see babies at [facility] Accepting Medicaid [facility] providers who accept newborns: Koirala, MD; Morrow, MD; Wolters, MD [address], [place], [place] [address] [phone] Mon-Fri 8:00-5:30 Babies seen by providers at [facility] Does NOT accept Medicaid Please call early in hospitalization for appointment (limited availability)  [facility] Mulberry, MD [address] [place], [place] [address] [phone] Mon, Tue, Thur, Fri 8:30-5:00, Wed 10:00-7:00 (closed 1-2pm) Babies seen by [facility] providers Accepting Medicaid Rubin - Pediatrician Rubin, MD [address], [place], [place] [address] [phone] Mon-Fri 8:30-5:00, Sat 8:30-12:00 Provider comes to see babies at [facility] Accepting Medicaid Must have been referred from current patients or contacted office prior to delivery [facility] ([facility]) Brown, MD; Chandler, MD; Ettefagh, MD; Grant, MD; Lester, MD; McCormick, MD; McQueen, MD; Prose, MD; Simha, MD; Stanley, MD; Stryffeler, NP; Tebben, NP [address], [place], [place] [address] [phone] Mon, Tue, Thur, Fri 8:30-5:30, Wed 9:30-5:30, Sat 8:30-12:30 Babies seen by [facility] providers Accepting Medicaid Only accepting infants of first-time parents or siblings of current patients Hospital discharge coordinator will make follow-up appointment Jack Amos [address], [place], [place]  [address] [phone]   Fax - [phone] [facility] [address], [place], [place]  [address] Phone - [phone]   Fax - [phone] Shilpa Gosrani [address], [place], [place]  [address] [phone]  [place] ([address]) [facility] Bates, MD; Brassfield, MD; Cooper, Cox, MD; MD; Davis, MD; Dovico, MD; Ettefaugh, MD; Little, MD; Lowe, MD; Keiffer, MD; Melvin, MD; Sumner, MD; Williams, MD [address], [place], [place] [address] [phone] Mon-Fri 8:30-5:00 (extended evenings Mon-Thur as needed), Sat-Sun 10:00-1:00 Providers come to see babies at [facility] Accepting Medicaid for families of first-time babies and families with all children in the household age 3 and under. Must register with office prior to making appointment (M-F only). [facility] Henson, NP; Knapp, MD; Lalonde, MD; Tysinger, PA [address] [place], [place] [address] [phone] Mon-Fri 8:00-5:00 Babies seen by providers at [facility] Does NOT accept Medicaid/Commercial Insurance Only [facility] ([facility])  Artis, MD; Barnes, MD; Bratton, MD; Coccaro, MD; Lockett Gardner, MD; Kramer, MD; Marshall, MD; Netherton, MD; Poleto, MD; Skinner, MD [address] [place], [place] [address] [phone] Mon-Fri 8:30-5:30, Sat (Oct.-Mar.) 9:00-1:00 Babies seen by providers at [facility] Accepting Medicaid  [place] ([address]) [facility] Reid, MD; Warner, MD [address], [place], [place] [address] [phone] Mon-Fri 8:30-5:00, Sat 8:30-12:00 Providers come to see babies at [facility] Does NOT accept Medicaid [facility] Becker, PA; Hagler, MD; Scifres, PA; Sun, MD; Swayne, MD [address], [place],  [address] [phone] Mon-Fri 8:00-5:00 Babies seen by providers at [facility] Does NOT accept Medicaid Only accepting babies of parents who  are patients Please call early in hospitalization for appointment (limited availability) [facility] Clark, MD; Frye, MD; Kelleher, MD; Mack, NP; Miller, MD; O'Keller, MD; Patterson, NP; Pudlo, MD; Puzio, MD; Thomas, MD; Tucker, MD; Twiselton, MD [address], [place], [place] [address] [phone] Mon-Fri 8:00-5:00, Sat 9:00-12:00 Providers come to see babies at [facility] Does NOT accept Medicaid  [place] ([address]) [facility] providers accepting new patients: Brake, NP; Wharton, PA [address], [place], [place] [address] [phone] Mon-Fri 8:00-5:00 Babies seen by providers at [facility] Does NOT accept Medicaid Only accepting babies of parents who are patients Please call early in hospitalization for appointment (limited availability) [facility] Gay, MD; Quinlan, MD [address] [place], [place] [address] [phone] (press 1 to schedule appointment) Mon-Fri 8:00-5:00 Providers come to see babies at [facility] Does NOT accept Medicaid [facility] Mazer, MD [address] [place], [place] [address] [phone] Mon-Fri 8:30-5:00 (lunch 12:30-1:00), extended hours by appointment only Wed 5:00-6:30 Babies seen by [facility] providers Accepting Medicaid [facility] Banks, MD; Jordan, MD; Koberlein, MD [address], [place], [place] [address] [phone] Mon-Fri 8:00-5:00 Babies seen by [facility] providers Does NOT accept Medicaid [facility] Parker, MD; Hunter, MD; Wallace, DO [address] [place], [place] [address] [phone] Mon-Fri 8:00-5:00 Babies seen by [facility] providers Does NOT accept Medicaid [facility] Brandon, PA; Brecken, PA; Christy, NP; Dees, MD; DeClaire, MD; DeWeese, MD; Hansen, NP; Mills, NP; Parrish, NP; Smoot, NP; Summer, MD; Vapne,  MD [address] [place], [place] [address] [phone] Mon-Fri 8:30-5:00, Sat 10:00-1:00 Providers come to see babies at [facility] Does NOT accept Medicaid Free prenatal information session Tuesdays at 4:45pm [facility] Bouska, MD; Gordon, PA; Jeffery, PA; Weber, PA [address] [place] [address] [phone] Mon-Fri 7:30-5:30 Babies seen by [facility] providers [facility] [address], [place], [place]  [address] [phone]   Fax - [phone]  [place] ([address] & [address]) [facility] [place] [address] [place], [place] [address] [phone] Mon-Thur 8:00-6:00 Providers come to see babies at [facility] Accepting Medicaid [facility] Anderson, NP; Badger, MD; Beal, PA; Spencer, PA [address] [place], [place] [address] [phone] Mon-Thur 7:30-7:30, Fri 7:30-4:30 Babies seen by [facility] providers Accepting Medicaid [facility] Agbuya, MD; Klett, NP; Romgoolam, MD [address], [place], [place] [address] [phone] Mon-Fri 8:30-5:00, Sat 8:30-12:00 Providers come to see babies at [facility] Accepting Medicaid Must have "Meet & Greet" appointment at office prior to delivery [facility] ([facility]) McCord, MD; Wallace, MD; Wood, MD [address], [place], [place] [address] [phone] Mon-Wed 8:00-6:00, Thur-Fri 8:00-5:00, Sat 9:00-12:00 Providers come to see babies at [facility] Does NOT accept Medicaid Only accepting siblings of current patients [facility]  [address], [place], [place]  [address] [phone]   Fax - [phone] [facility] [address], [place], [place]  [address] [phone]   Fax -  [phone]  [place] ([address] & [address]) [facility] Cirigliano, DO; Matthews, DO [address] [place], [place] [address] [phone] Mon-Fri 7:00-5:00 Babies seen by [facility] providers Does NOT accept Medicaid [facility] Briscoe, MD; Howley, PA; Moreira, PA [address], [place], [place] [address] [phone] Mon-Fri 8:00-5:00 Babies seen by [facility] providers Accepting Medicaid [facility] Boyd, MD; Church, PA; Jones, NP; Osborn, PA [address], [place], [place] [address] [phone] Mon-Fri 8:00-5:00 Babies seen by providers at [facility] Accepting Medicaid  [place] ([address]) [facility] [place] Wendling, DO [address] [place], [place] [address] [phone] Mon-Fri 8:00-5:00 Babies seen by [facility] providers Does NOT accept Medicaid Limited availability, please call early in hospitalization to schedule follow-up [facility] Calderon, PA; Cummings, MD; Dillard, MD; Martin, PA; Olson, MD; VanDeven, PA [address], [place], [place] [address] [phone] Mon-Fri 8:30-5:00, Sat 9:00-12:00 Babies seen by providers at [facility] Accepting Medicaid Please register online then schedule online or call office [email or web address] Hunter, NP; Kumar, MD; Martin Rogers, PA [address], [place], [place] [address] [phone] Mon-Fri 8:00-5:00 Babies seen by providers at [facility] Accepting Medicaid [facility] ([facility]) Bel[place], MD; Kristi Fleenor, NP; West, MD [address], [place], [place] [address] [phone] Mon-Fri 8:00-5:30, Sat&Sun by appointment (phones open at  8:30) Babies seen by [facility] providers Accepting Medicaid Must be a first-time baby or sibling of current patient [facility] [place]  [address], [place], [place]  [address] [phone]   Fax - [phone]  [place] ([address]) [facility] Brown, PA; Cowen, PA; Rice, MD; Helton, PA; Spry, MD [address] [place], [place] [address] [phone] Mon-Thur 8:00-7:00, Fri 8:00-5:00, Sat 8:00-12:00, Sun 9:00-12:00 Babies seen by [facility] providers Accepting Medicaid [facility] Coe-Goins, MD; Marshall, MD; Pierre-Louis, MD [address] [place], [place], [place] [address] [phone] Mon-Thur 8:00-5:00 Babies seen by providers at [facility] Accepting Medicaid [facility] Bratton, MD; Coe-Goins, MD; Hayes, MD; Lewis, MD; List, MD; Lott, MD; Marshall, MD; Moran, MD; O'Neal, MD; Pierre-Louis, MD; Pitonzo, MD; Scholer, MD; Spangle, MD [address] [place], [place] [address] [phone] Mon-Fri 8:00-5:30, Sat (Oct.-Mar.) 9:00-1:00 Babies seen by providers at [facility] Accepting Medicaid Must fill out new patient packet, available online at [email or web address] [facility] ([facility]) Friddle, NP; Harris, NP; Kelly, NP; Logan, MD; Melvin, PA; Poth, MD; Ramadoss, MD; Stanton, NP [address], [place], [place] [address] [phone] Mon-Thur 8:00-5:30, Fri 8:00-5:00 Babies seen by providers at [facility] Accepting Medicaid  [place] ([address]) [facility] Dixon, PA; [place]ville, MD; Pickard, MD; Tapia, PA [address], [place], [place] [address] [phone] Mon-Fri 8:00-5:00 Babies seen by providers at [facility] Accepting Medicaid   [place] ([address]) [facility] [place] Masneri, DO; Meyers, MD; Nelson, PA [address], [place], [place] [address] [phone] Mon-Fri 8:00-5:00 Babies seen by providers at [facility] Does NOT accept Medicaid Limited appointment availability, please call early in hospitalization  [facility] [place] Kunedd, DO; McGowen, MD [address], [place], [place] [address] [phone] Mon-Fri 8:00-5:00 Babies seen by [facility] providers Does NOT accept Medicaid [facility] [place] Cameron, MD; MacDonald, MD; Michaels, PA; Nayak, MD [address] [place] [address], [place],  [address] [phone] Mon-Fri 8:00-5:00 After hours clinic ([address] [place],  [address]) [phone] Mon-Fri 5:00-8:00, Sat 12:00-6:00, Sun 10:00-4:00 Babies seen by [facility] providers Accepting Medicaid [facility] [place] [address], [place], [place]  [address] [phone]   Fax - [phone]  [place] ([address]) [facility] [place] [facility] Andy, MD [address], [place], [place] [address] [phone] Mon-Fri 8:00-5:00 Babies seen by [facility] providers Does NOT accept Medicaid [facility] [place] ([facility] [place]) Eksir, MD [address], [place], [place] [address] [phone] Mon-Thur 8:00-7:00, Fri 8:00-5:00, Sat 8:00-12:00 Babies seen by providers at [facility] Accepting Medicaid - but does not have vaccinations in office (must be received elsewhere) Limited availability, please call early in hospitalization  [place] ([address]) [facility]  Charlene Flemming, MD [address], [place] [address] [phone]  Fax [phone]  [facility]  Kimberly Newton, MD, Annamarie Streilein, PA, Carla Hampton, PA [address] [place], [place]  [address] [phone] [facility]  [address], [place], [place] [address] [phone] [address], [place], [place] [address] [phone] (West Office)  [facility] [address], [place], [place] [address] [phone] [facility] [address], [place], [place] [address] [phone] [facility] [address], [place], [place] [address] [phone] [facility] [address], [place], [place] [address] [phone] [facility] [address], [place], [place] [address] [phone] [facility] [address], [place], [place] [address] [phone] [facility]  [address], [place], [place] [address] [phone] [facility] [address], [place], [place] [address] [phone] [facility] [address], [place], [place] [address] [phone] [facility] [address], [place], [place] [address] [phone]  

## 2020-10-01 NOTE — Progress Notes (Signed)
   PRENATAL VISIT NOTE  Subjective:  Kelsey Dougherty is a 25 y.o. G2P1001 at [redacted]w[redacted]d being seen today for ongoing prenatal care.  She is currently monitored for the following issues for this low-risk pregnancy and has Encounter for supervision of normal pregnancy in first trimester and Poor weight gain of pregnancy, second trimester on their problem list.  Patient reports no complaints.  Contractions: Not present. Vag. Bleeding: None.  Movement: Present. Denies leaking of fluid.   The following portions of the patient's history were reviewed and updated as appropriate: allergies, current medications, past family history, past medical history, past social history, past surgical history and problem list.   Objective:   Vitals:   10/01/20 0833  BP: 114/75  Pulse: 74  Weight: 152 lb (68.9 kg)    Fetal Status: Fetal Heart Rate (bpm): 150 Fundal Height: 28 cm Movement: Present     General:  Alert, oriented and cooperative. Patient is in no acute distress.  Skin: Skin is warm and dry. No rash noted.   Cardiovascular: Normal heart rate noted  Respiratory: Normal respiratory effort, no problems with respiration noted  Abdomen: Soft, gravid, appropriate for gestational age.  Pain/Pressure: Absent     Pelvic: Cervical exam deferred        Extremities: Normal range of motion.  Edema: None  Mental Status: Normal mood and affect. Normal behavior. Normal judgment and thought content.   Assessment and Plan:  Pregnancy: G2P1001 at [redacted]w[redacted]d 1. Encounter for supervision of other normal pregnancy in first trimester Patient is doing well Third trimester labs today  Patient is researching pediatrician  Preterm labor symptoms and general obstetric precautions including but not limited to vaginal bleeding, contractions, leaking of fluid and fetal movement were reviewed in detail with the patient. Please refer to After Visit Summary for other counseling recommendations.   Return in about 2 weeks (around  10/15/2020) for in person, ROB, Low risk.  Future Appointments  Date Time Provider Department Center  10/01/2020  9:55 AM Janeane Cozart, Gigi Gin, MD [facility] None  10/15/2020  2:30 PM Adely Facer, Gigi Gin, MD [facility] None  10/29/2020  1:30 PM Windell Norfolk, MD [facility] None    Catalina Antigua, MD

## 2020-10-01 NOTE — Progress Notes (Signed)
Patient with an episode of emesis before her 1 hour blood draw. Patient will return for repeat glucola

## 2020-10-01 NOTE — Addendum Note (Signed)
Addended by: Catalina Antigua on: 10/01/2020 09:45 AM   Modules accepted: Orders

## 2020-10-02 LAB — HIV ANTIBODY (ROUTINE TESTING W REFLEX): HIV Screen 4th Generation wRfx: NONREACTIVE

## 2020-10-02 LAB — CBC
Hematocrit: 34.9 % (ref 34.0–46.6)
Hemoglobin: 11.2 g/dL (ref 11.1–15.9)
MCH: 30.1 pg (ref 26.6–33.0)
MCHC: 32.1 g/dL (ref 31.5–35.7)
MCV: 94 fL (ref 79–97)
Platelets: 201 10*3/uL (ref 150–450)
RBC: 3.72 x10E6/uL — ABNORMAL LOW (ref 3.77–5.28)
RDW: 12.7 % (ref 11.7–15.4)
WBC: 10.5 10*3/uL (ref 3.4–10.8)

## 2020-10-02 LAB — RPR: RPR Ser Ql: NONREACTIVE

## 2020-10-08 ENCOUNTER — Other Ambulatory Visit: Payer: Self-pay

## 2020-10-08 ENCOUNTER — Other Ambulatory Visit: Payer: BC Managed Care – PPO

## 2020-10-08 DIAGNOSIS — Z3481 Encounter for supervision of other normal pregnancy, first trimester: Secondary | ICD-10-CM

## 2020-10-09 LAB — GLUCOSE TOLERANCE, 2 HOURS W/ 1HR
Glucose, 1 hour: 94 mg/dL (ref 65–179)
Glucose, 2 hour: 80 mg/dL (ref 65–152)
Glucose, Fasting: 78 mg/dL (ref 65–91)

## 2020-10-15 ENCOUNTER — Encounter: Payer: Self-pay | Admitting: Obstetrics and Gynecology

## 2020-10-15 ENCOUNTER — Ambulatory Visit (INDEPENDENT_AMBULATORY_CARE_PROVIDER_SITE_OTHER): Payer: BC Managed Care – PPO | Admitting: Obstetrics and Gynecology

## 2020-10-15 ENCOUNTER — Other Ambulatory Visit: Payer: Self-pay

## 2020-10-15 VITALS — BP 108/67 | HR 97 | Wt 156.0 lb

## 2020-10-15 DIAGNOSIS — Z3481 Encounter for supervision of other normal pregnancy, first trimester: Secondary | ICD-10-CM | POA: Diagnosis not present

## 2020-10-15 DIAGNOSIS — Z23 Encounter for immunization: Secondary | ICD-10-CM | POA: Diagnosis not present

## 2020-10-15 NOTE — Progress Notes (Signed)
   PRENATAL VISIT NOTE  Subjective:  Kelsey Dougherty is a 25 y.o. G2P1001 at [redacted]w[redacted]d being seen today for ongoing prenatal care.  She is currently monitored for the following issues for this low-risk pregnancy and has Encounter for supervision of normal pregnancy in first trimester and Poor weight gain of pregnancy, second trimester on their problem list.  Patient reports no complaints.  Contractions: Not present. Vag. Bleeding: None.  Movement: Present. Denies leaking of fluid.   The following portions of the patient's history were reviewed and updated as appropriate: allergies, current medications, past family history, past medical history, past social history, past surgical history and problem list.   Objective:   Vitals:   10/15/20 1430  BP: 108/67  Pulse: 97  Weight: 156 lb (70.8 kg)    Fetal Status: Fetal Heart Rate (bpm): 135   Movement: Present     General:  Alert, oriented and cooperative. Patient is in no acute distress.  Skin: Skin is warm and dry. No rash noted.   Cardiovascular: Normal heart rate noted  Respiratory: Normal respiratory effort, no problems with respiration noted  Abdomen: Soft, gravid, appropriate for gestational age.  Pain/Pressure: Absent     Pelvic: Cervical exam deferred        Extremities: Normal range of motion.  Edema: None  Mental Status: Normal mood and affect. Normal behavior. Normal judgment and thought content.   Assessment and Plan:  Pregnancy: G2P1001 at [redacted]w[redacted]d 1. Encounter for supervision of other normal pregnancy in first trimester Patient is doing well without complaints - Tdap vaccine greater than or equal to 7yo IM  Preterm labor symptoms and general obstetric precautions including but not limited to vaginal bleeding, contractions, leaking of fluid and fetal movement were reviewed in detail with the patient. Please refer to After Visit Summary for other counseling recommendations.   Return in about 2 weeks (around 10/29/2020) for in  person, ROB, Low risk.  Future Appointments  Date Time Provider Department Center  10/29/2020  1:30 PM Windell Norfolk, MD [facility] None    Catalina Antigua, MD

## 2020-10-15 NOTE — Progress Notes (Signed)
ROB 30.3 wks Concerned with "iron" level  Wants TDAP

## 2020-10-23 ENCOUNTER — Ambulatory Visit: Payer: BC Managed Care – PPO | Admitting: Diagnostic Neuroimaging

## 2020-10-29 ENCOUNTER — Ambulatory Visit (INDEPENDENT_AMBULATORY_CARE_PROVIDER_SITE_OTHER): Payer: BC Managed Care – PPO | Admitting: Neurology

## 2020-10-29 ENCOUNTER — Ambulatory Visit (INDEPENDENT_AMBULATORY_CARE_PROVIDER_SITE_OTHER): Payer: BC Managed Care – PPO | Admitting: Advanced Practice Midwife

## 2020-10-29 ENCOUNTER — Encounter: Payer: Self-pay | Admitting: Neurology

## 2020-10-29 ENCOUNTER — Other Ambulatory Visit: Payer: Self-pay

## 2020-10-29 VITALS — BP 107/69 | HR 84 | Ht 64.0 in | Wt 156.0 lb

## 2020-10-29 VITALS — BP 109/66 | HR 85 | Wt 154.2 lb

## 2020-10-29 DIAGNOSIS — Z3A32 32 weeks gestation of pregnancy: Secondary | ICD-10-CM

## 2020-10-29 DIAGNOSIS — O2613 Low weight gain in pregnancy, third trimester: Secondary | ICD-10-CM

## 2020-10-29 DIAGNOSIS — Z23 Encounter for immunization: Secondary | ICD-10-CM

## 2020-10-29 DIAGNOSIS — O26843 Uterine size-date discrepancy, third trimester: Secondary | ICD-10-CM

## 2020-10-29 DIAGNOSIS — R52 Pain, unspecified: Secondary | ICD-10-CM

## 2020-10-29 DIAGNOSIS — O2612 Low weight gain in pregnancy, second trimester: Secondary | ICD-10-CM

## 2020-10-29 DIAGNOSIS — Z3481 Encounter for supervision of other normal pregnancy, first trimester: Secondary | ICD-10-CM

## 2020-10-29 NOTE — Progress Notes (Signed)
   PRENATAL VISIT NOTE  Subjective:  Kelsey Dougherty is a 25 y.o. G2P1001 at [redacted]w[redacted]d being seen today for ongoing prenatal care.  She is currently monitored for the following issues for this low-risk pregnancy and has Encounter for supervision of normal pregnancy in first trimester and Poor weight gain of pregnancy, second trimester on their problem list.  Patient reports no complaints.  Contractions: Not present. Vag. Bleeding: None.  Movement: Present. Denies leaking of fluid.   The following portions of the patient's history were reviewed and updated as appropriate: allergies, current medications, past family history, past medical history, past social history, past surgical history and problem list.   Objective:   Vitals:   10/29/20 1552  BP: 109/66  Pulse: 85  Weight: 154 lb 3.2 oz (69.9 kg)    Fetal Status: Fetal Heart Rate (bpm): 150 Fundal Height: 30 cm Movement: Present     General:  Alert, oriented and cooperative. Patient is in no acute distress.  Skin: Skin is warm and dry. No rash noted.   Cardiovascular: Normal heart rate noted  Respiratory: Normal respiratory effort, no problems with respiration noted  Abdomen: Soft, gravid, appropriate for gestational age.  Pain/Pressure: Absent     Pelvic: Cervical exam deferred        Extremities: Normal range of motion.  Edema: None  Mental Status: Normal mood and affect. Normal behavior. Normal judgment and thought content.   Assessment and Plan:  Pregnancy: G2P1001 at [redacted]w[redacted]d 1. Encounter for supervision of other normal pregnancy in first trimester --Anticipatory guidance about next visits/weeks of pregnancy given. --Next visit in 2 weeks - Flu Vaccine QUAD 36+ mos IM (Fluarix, Quad PF)  2. Poor weight gain of pregnancy, second trimester --Overall weight in pregnancy down 12 lbs from pregravid weight. Pt reports weight gain with improved appetite in recent weeks. Discussed ways to increase calories. --FH measures small, will order  growth  [place].  3. [redacted] weeks gestation of pregnancy   4. Encounter for immunization  - Flu Vaccine QUAD 36+ mos IM (Fluarix, Quad PF)  5. Uterine size date discrepancy pregnancy, third trimester --F/U ultrasound ordered with MFM   Preterm labor symptoms and general obstetric precautions including but not limited to vaginal bleeding, contractions, leaking of fluid and fetal movement were reviewed in detail with the patient. Please refer to After Visit Summary for other counseling recommendations.   Return in about 2 weeks (around 11/12/2020).  No future appointments.   Sharen Counter, CNM

## 2020-10-29 NOTE — Progress Notes (Signed)
[facility]  PATIENT: Kelsey Dougherty DOB: 1995/06/05  REFERRING CLINICIAN: Elvera Maria,* HISTORY FROM: Patient  REASON FOR VISIT: Right hand and right foot pain    HISTORICAL  CHIEF COMPLAINT:  Chief Complaint  Patient presents with   New Patient (Initial Visit)    Room 12 w/ toddler daughter. She is [redacted] weeks pregnant w/ EDD 12/21/20. Reports intermittent, throbbing pain that feels like "sunburn" to the touch. Typically, present in right hand radiating into lower arm and right foot.    HISTORY OF PRESENT ILLNESS:  This is a 25 year old woman with no past medical history who is presenting with complaint of intermittent pain in the right hand that radiates up to the forearm, described the pain as throbbing and feels like sunburn to touch.  Patient also noted the pain in her right foot, this pain does not radiate but again feel hot to touch.  She noted this symptom initially 2 years ago when she was pregnant with her first baby and pain has been intermittent since then. The last episode was 2 months ago.  Pain usually last a couple hours before subsiding.  Denies any neck pain, denies any back pain, denies any pain on the left side of her body.  Patient is [redacted] weeks pregnant and due in December.   OTHER MEDICAL CONDITIONS: None    REVIEW OF SYSTEMS: Full 14 system review of systems performed and negative with exception of: as noted in the HPI  ALLERGIES: No Known Allergies  HOME MEDICATIONS: Outpatient Medications Prior to Visit  Medication Sig Dispense Refill   acyclovir (ZOVIRAX) 400 MG tablet Take 400 mg by mouth as needed.     Blood Pressure Monitoring (BLOOD PRESSURE KIT) DEVI 1 kit by Does not apply route once a week. 1 each 0   docusate sodium (COLACE) 100 MG capsule Take 1 capsule (100 mg total) by mouth 2 (two) times daily as needed. 30 capsule 2   Prenatal MV & Min w/FA-DHA (PRENATAL GUMMIES PO) Take by mouth.     No facility-administered  medications prior to visit.    PAST MEDICAL HISTORY: Past Medical History:  Diagnosis Date   Anxiety    Depression    Recurrent cold sores     PAST SURGICAL HISTORY: Past Surgical History:  Procedure Laterality Date   EYE SURGERY Right 2016   x3   RHINOPLASTY  2016   WISDOM TOOTH EXTRACTION  2017    FAMILY HISTORY: Family History  Problem Relation Age of Onset   Hypertension Mother    Skin cancer Father     SOCIAL HISTORY: Social History   Socioeconomic History   Marital status: Single    Spouse name: Not on file   Number of children: 1   Years of education: college   Highest education level: Associate degree: academic program  Occupational History   Occupation: Psychologist, sport and exercise  Tobacco Use   Smoking status: Never   Smokeless tobacco: Never  Vaping Use   Vaping Use: Never used  Substance and Sexual Activity   Alcohol use: Not Currently    Comment: occ, not since confirmed preg   Drug use: Not Currently   Sexual activity: Yes    Partners: Male    Birth control/protection: None  Other Topics Concern   Not on file  Social History Narrative   Lives at home with boyfriend and toddler daughter. Pregnant now w/ EDD 12/21/20 (having girl).   Left-handed.   Occasional caffeine.   Social  Determinants of Health   Financial Resource Strain: Not on file  Food Insecurity: Not on file  Transportation Needs: Not on file  Physical Activity: Not on file  Stress: Not on file  Social Connections: Not on file  Intimate Partner Violence: Not on file    PHYSICAL EXAM  GENERAL EXAM/CONSTITUTIONAL: Vitals:  Vitals:   10/29/20 1315  BP: 107/69  Pulse: 84  Weight: 156 lb (70.8 kg)  Height: '5\' 4"'  (1.626 m)   Body mass index is 26.78 kg/m. Wt Readings from Last 3 Encounters:  10/29/20 156 lb (70.8 kg)  10/15/20 156 lb (70.8 kg)  10/01/20 152 lb (68.9 kg)   Patient is in no distress; well developed, nourished and groomed; neck is supple   EYES: Pupils  round and reactive to light, Visual fields full to confrontation, Extraocular movements intacts,   MUSCULOSKELETAL: Gait, strength, tone, movements noted in Neurologic exam below  NEUROLOGIC: MENTAL STATUS:  No flowsheet data found. awake, alert, oriented to person, place and time recent and remote memory intact normal attention and concentration language fluent, comprehension intact, naming intact fund of knowledge appropriate  CRANIAL NERVE:  2nd, 3rd, 4th, 6th - pupils equal and reactive to light, visual fields full to confrontation, extraocular muscles intact, no nystagmus 5th - facial sensation symmetric 7th - facial strength symmetric 8th - hearing intact 9th - palate elevates symmetrically, uvula midline 11th - shoulder shrug symmetric 12th - tongue protrusion midline  MOTOR:  normal bulk and tone, full strength in the BUE, BLE  SENSORY:  normal and symmetric to light touch, pinprick, temperature, vibration  COORDINATION:  finger-nose-finger, fine finger movements normal  REFLEXES:  deep tendon reflexes present and symmetric  GAIT/STATION:  normal    DIAGNOSTIC DATA (LABS, IMAGING, TESTING) - I reviewed patient records, labs, notes, testing and imaging myself where available.  Lab Results  Component Value Date   WBC 10.5 10/01/2020   HGB 11.2 10/01/2020   HCT 34.9 10/01/2020   MCV 94 10/01/2020   PLT 201 10/01/2020      Component Value Date/Time   NA 135 08/10/2020 0331   K 3.3 (L) 08/10/2020 0331   CL 107 08/10/2020 0331   CO2 20 (L) 08/10/2020 0331   GLUCOSE 109 (H) 08/10/2020 0331   BUN 6 08/10/2020 0331   CREATININE 0.66 08/10/2020 0331   CALCIUM 8.3 (L) 08/10/2020 0331   GFRNONAA >60 08/10/2020 0331   No results found for: CHOL, HDL, LDLCALC, LDLDIRECT, TRIG, CHOLHDL Lab Results  Component Value Date   HGBA1C 4.8 06/12/2020   No results found for: VITAMINB12 No results found for: TSH    ASSESSMENT AND PLAN  25 y.o. year old female  with with no past medical history who is presenting with intermittent right hand and right foot pain.  Patient said the pain started during her first pregnancy 2 years ago.  Since then she has been having intermittent pain.  Right hand pain is described as throbbing and sometimes radiate to the forearm with a feeling of hot to touch (described as a sunburn).  At this point the etiology is unclear but I suspect carpal tunnel in the right hand.  Since her last episode is 78-monthago and patient is [redacted] weeks pregnant I will defer any medication or treatment currently.  Advised the patient, after delivery if the pain is still present/worse to [place].  Return as needed   1. Pain     PLAN: Continue with your current  medications  If pain persist after delivery, please return to clinic   No orders of the defined types were placed in this encounter.   No orders of the defined types were placed in this encounter.   Return if symptoms worsen or fail to improve.    Alric Ran, MD 10/29/2020, 1:50 PM  [facility] [address] [place], [place] [address] [phone]

## 2020-10-29 NOTE — Patient Instructions (Signed)
Continue with your current medications  If pain persist after delivery, please return to clinic

## 2020-11-05 ENCOUNTER — Encounter: Payer: Self-pay | Admitting: *Deleted

## 2020-11-05 ENCOUNTER — Ambulatory Visit: Payer: BC Managed Care – PPO | Attending: Advanced Practice Midwife

## 2020-11-05 ENCOUNTER — Other Ambulatory Visit: Payer: Self-pay

## 2020-11-05 ENCOUNTER — Ambulatory Visit: Payer: BC Managed Care – PPO | Admitting: *Deleted

## 2020-11-05 VITALS — BP 112/64 | HR 80

## 2020-11-05 DIAGNOSIS — Z3A33 33 weeks gestation of pregnancy: Secondary | ICD-10-CM

## 2020-11-05 DIAGNOSIS — Z362 Encounter for other antenatal screening follow-up: Secondary | ICD-10-CM | POA: Diagnosis present

## 2020-11-05 DIAGNOSIS — O26843 Uterine size-date discrepancy, third trimester: Secondary | ICD-10-CM

## 2020-11-06 ENCOUNTER — Other Ambulatory Visit: Payer: Self-pay | Admitting: *Deleted

## 2020-11-06 DIAGNOSIS — O365990 Maternal care for other known or suspected poor fetal growth, unspecified trimester, not applicable or unspecified: Secondary | ICD-10-CM

## 2020-11-12 ENCOUNTER — Other Ambulatory Visit: Payer: Self-pay

## 2020-11-12 ENCOUNTER — Ambulatory Visit (INDEPENDENT_AMBULATORY_CARE_PROVIDER_SITE_OTHER): Payer: BC Managed Care – PPO

## 2020-11-12 VITALS — BP 120/77 | HR 93 | Temp 98.3°F | Wt 157.2 lb

## 2020-11-12 DIAGNOSIS — Z3A34 34 weeks gestation of pregnancy: Secondary | ICD-10-CM

## 2020-11-12 DIAGNOSIS — O2613 Low weight gain in pregnancy, third trimester: Secondary | ICD-10-CM

## 2020-11-12 DIAGNOSIS — Z3403 Encounter for supervision of normal first pregnancy, third trimester: Secondary | ICD-10-CM

## 2020-11-12 DIAGNOSIS — O26843 Uterine size-date discrepancy, third trimester: Secondary | ICD-10-CM

## 2020-11-12 NOTE — Progress Notes (Signed)
   PRENATAL VISIT NOTE  Subjective:  Kelsey Dougherty is a 25 y.o. G2P1001 at [redacted]w[redacted]d being seen today for ongoing prenatal care.  She is currently monitored for the following issues for this low-risk pregnancy and has Encounter for supervision of normal pregnancy in first trimester and Poor weight gain of pregnancy, second trimester on their problem list.  Patient reports no complaints.  Contractions: Not present. Vag. Bleeding: None.  Movement: Present. Denies leaking of fluid.   The following portions of the patient's history were reviewed and updated as appropriate: allergies, current medications, past family history, past medical history, past social history, past surgical history and problem list.   Objective:   Vitals:   11/12/20 1353  BP: 120/77  Pulse: 93  Temp: 98.3 F (36.8 C)  Weight: 157 lb 3.2 oz (71.3 kg)    Fetal Status: Fetal Heart Rate (bpm): 145 Fundal Height: 31 cm Movement: Present     General:  Alert, oriented and cooperative. Patient is in no acute distress.  Skin: Skin is warm and dry. No rash noted.   Cardiovascular: Normal heart rate noted  Respiratory: Normal respiratory effort, no problems with respiration noted  Abdomen: Soft, gravid, appropriate for gestational age.  Pain/Pressure: Absent     Pelvic: Cervical exam deferred        Extremities: Normal range of motion.  Edema: Trace  Mental Status: Normal mood and affect. Normal behavior. Normal judgment and thought content.   Assessment and Plan:  Pregnancy: G2P1001 at [redacted]w[redacted]d 1. Encounter for supervision of normal first pregnancy in third trimester - Routine OB care - Doing well. No concerns. Has questions about growth ultrasound regarding fetal growth. Discussed that baby is not growth restricted as of last ultrasound but will have a f/u ultrasound to reevaluate. Reassurance provided on most recent ultrasound.  - Reviewed fetal kick counts  2. [redacted] weeks gestation of pregnancy - Endorses active fetal  movement  3. Poor weight gain of pregnancy, third trimester   4. Uterine size date discrepancy pregnancy, third trimester - EFW 1953g, 15% on 10/24 - FH 31cm today - F/u ultrasound scheduled with MFM   Preterm labor symptoms and general obstetric precautions including but not limited to vaginal bleeding, contractions, leaking of fluid and fetal movement were reviewed in detail with the patient. Please refer to After Visit Summary for other counseling recommendations.   Return in about 2 weeks (around 11/26/2020).  Future Appointments  Date Time Provider Department Center  11/26/2020  2:50 PM Hurshel Party, CNM [facility] None  12/03/2020  2:45 PM [facility] NURSE [facility]  12/03/2020  3:00 PM [facility] US1 [facility]     Brand Males, CNM 11/12/20 2:55 PM

## 2020-11-20 ENCOUNTER — Emergency Department (HOSPITAL_COMMUNITY)
Admission: EM | Admit: 2020-11-20 | Discharge: 2020-11-21 | Disposition: A | Discharge status: Home / Self Care | Payer: BC Managed Care – PPO | Attending: Emergency Medicine | Admitting: Emergency Medicine

## 2020-11-20 ENCOUNTER — Inpatient Hospital Stay (HOSPITAL_COMMUNITY)
Admission: AD | Admit: 2020-11-20 | Discharge: 2020-11-20 | Disposition: A | Discharge status: Still In Hospital | Payer: BC Managed Care – PPO | Source: Home / Self Care | Attending: Obstetrics and Gynecology | Admitting: Obstetrics and Gynecology

## 2020-11-20 ENCOUNTER — Emergency Department (HOSPITAL_COMMUNITY): Payer: BC Managed Care – PPO

## 2020-11-20 ENCOUNTER — Encounter (HOSPITAL_COMMUNITY): Payer: Self-pay | Admitting: Emergency Medicine

## 2020-11-20 ENCOUNTER — Other Ambulatory Visit: Payer: Self-pay

## 2020-11-20 DIAGNOSIS — Y929 Unspecified place or not applicable: Secondary | ICD-10-CM | POA: Insufficient documentation

## 2020-11-20 DIAGNOSIS — T148XXA Other injury of unspecified body region, initial encounter: Secondary | ICD-10-CM

## 2020-11-20 DIAGNOSIS — Z23 Encounter for immunization: Secondary | ICD-10-CM | POA: Insufficient documentation

## 2020-11-20 DIAGNOSIS — W540XXA Bitten by dog, initial encounter: Secondary | ICD-10-CM | POA: Insufficient documentation

## 2020-11-20 DIAGNOSIS — O26893 Other specified pregnancy related conditions, third trimester: Secondary | ICD-10-CM | POA: Insufficient documentation

## 2020-11-20 DIAGNOSIS — S61234A Puncture wound without foreign body of right ring finger without damage to nail, initial encounter: Secondary | ICD-10-CM | POA: Diagnosis present

## 2020-11-20 DIAGNOSIS — S61254A Open bite of right ring finger without damage to nail, initial encounter: Secondary | ICD-10-CM | POA: Insufficient documentation

## 2020-11-20 DIAGNOSIS — Y939 Activity, unspecified: Secondary | ICD-10-CM | POA: Insufficient documentation

## 2020-11-20 DIAGNOSIS — Z3A35 35 weeks gestation of pregnancy: Secondary | ICD-10-CM | POA: Insufficient documentation

## 2020-11-20 DIAGNOSIS — Z203 Contact with and (suspected) exposure to rabies: Secondary | ICD-10-CM | POA: Diagnosis not present

## 2020-11-20 DIAGNOSIS — Z2914 Encounter for prophylactic rabies immune globin: Secondary | ICD-10-CM | POA: Insufficient documentation

## 2020-11-20 DIAGNOSIS — S50312A Abrasion of left elbow, initial encounter: Secondary | ICD-10-CM | POA: Insufficient documentation

## 2020-11-20 NOTE — MAU Note (Signed)
Camelia Eng CNM in Triage to talk with pt and discuss plan of care.

## 2020-11-20 NOTE — ED Provider Notes (Signed)
Emergency Medicine Provider Triage Evaluation Note  Kelsey Dougherty , a 25 y.o. female  was evaluated in triage.  Pt complains of presents with dog bite to the right ring finger, started today at 8 PM, her neighbors dog bit her right hand, unknown rabies vaccination, she is up-to-date on her tetanus shot.  Patient endorses pain at her finger, and difficulty with movement due to pain.  Review of Systems  Positive: Wound to right ring finger, pain. Negative: Paresthesias or weakness  Physical Exam  BP 133/90 (BP Location: Right Arm)   Pulse 78   Temp 98.9 F (37.2 C) (Oral)   Resp 14   LMP 03/16/2020   SpO2 99%  Gen:   Awake, no distress   Resp:  Normal effort  MSK:   Moves extremities without difficulty  Other:  Nailbed involvement, small puncture wound noted in the dorsal aspect of the right ring finger.  Hemodynamically stable.  Forage motion of all joints.  Medical Decision Making  Medically screening exam initiated at 10:30 PM.  Appropriate orders placed.  Lovinia Schild was informed that the remainder of the evaluation will be completed by another provider, this initial triage assessment does not replace that evaluation, and the importance of remaining in the ED until their evaluation is complete.  Presents with dog bite to the right ring finger will need further evaluation.  imaging were ordered.   Carroll Sage, PA-C 11/20/20 2232    Melene Plan, DO 11/20/20 2247

## 2020-11-20 NOTE — MAU Note (Signed)
[place] RN CN in Main ED called and notified of pt's status and plan for transfer to main ED related to dog bite

## 2020-11-20 NOTE — Progress Notes (Signed)
Pt ambulated to main ED with RN for further eval and treatment

## 2020-11-20 NOTE — MAU Provider Note (Signed)
Event Date/Time  First Provider Initiated Contact with Patient 11/20/20 2115      S [name] is a 25 y.o. G2P1001 patient who presents to MAU today with complaint of dog bite. Patient reports around 8pm she took her dog out when her neighbor's dog began attacking her dog. Patient reports that she tried to separate the dogs when the neighbor's dog bit her right ring finger. She is unsure of the dogs rabies vaccine status as she reports that the neighbor ran into her apartment after the incident occurred. She is up to date on her Tdap vaccine. She did not sustain any trauma to her abdomen. She denies contractions, vaginal bleeding, or leaking fluid. Endorses active fetal movement.    O BP 122/73   Pulse 89   Temp 97.6 F (36.4 C)   Resp 16   Ht 5\' 4"  (1.626 m)   Wt 71.2 kg   LMP 03/16/2020   BMI 26.95 kg/m  Physical Exam Vitals and nursing note reviewed.  Constitutional:      General: She is not in acute distress. HENT:     Head: Normocephalic.  Eyes:     Pupils: Pupils are equal, round, and reactive to light.  Cardiovascular:     Rate and Rhythm: Normal rate.  Pulmonary:     Effort: Pulmonary effort is normal.  Musculoskeletal:        General: Normal range of motion.     Right hand: Tenderness present. No swelling or deformity.       Arms:     Cervical back: Normal range of motion.  Skin:    General: Skin is warm.  Neurological:     General: No focal deficit present.     Mental Status: She is alert and oriented to person, place, and time.  Psychiatric:        Mood and Affect: Mood normal.        Behavior: Behavior normal.        Thought Content: Thought content normal.        Judgment: Judgment normal.   FHT: 130 bpm via doppler  A Medical screening exam complete [redacted] weeks gestation of pregnancy Dog bite   P Discussed patient with [facility] provider, 05/16/2020, who accepts patient. Patient transferred to [facility] for further evaluation    [facility],  CNM 11/20/2020 9:51 PM

## 2020-11-20 NOTE — MAU Note (Signed)
ABout ago patient had her dog outside. Neighbor's dog came over and attacked pt's dog. Pt was trying to break them apart and neighbor's dog bit her right ring finger. Neighbor left so pt does not know if dog is up to date on rabies shot. Denies any pregnancy complaints

## 2020-11-20 NOTE — ED Triage Notes (Addendum)
Patient bit by a neighbor's dog this evening at right distal ring finger , immunization status of dog unknown , dressing applied prior to arrival . She is [redacted] weeks pregnant.

## 2020-11-21 DIAGNOSIS — S61234A Puncture wound without foreign body of right ring finger without damage to nail, initial encounter: Secondary | ICD-10-CM | POA: Diagnosis not present

## 2020-11-21 MED ORDER — RABIES VACCINE, PCEC IM SUSR
1.0000 mL | Freq: Once | INTRAMUSCULAR | Status: AC
  Administered 2020-11-21: 1 mL via INTRAMUSCULAR
  Filled 2020-11-21: qty 1

## 2020-11-21 MED ORDER — AMOXICILLIN-POT CLAVULANATE 875-125 MG PO TABS: 1.0000 | ORAL_TABLET | Freq: Two times a day (BID) | ORAL | 0 refills | Status: AC

## 2020-11-21 MED ORDER — RABIES IMMUNE GLOBULIN 150 UNIT/ML IM INJ
20.0000 [IU]/kg | INJECTION | Freq: Once | INTRAMUSCULAR | Status: AC
  Administered 2020-11-21: 1425 [IU] via INTRAMUSCULAR
  Filled 2020-11-21: qty 10

## 2020-11-21 NOTE — Discharge Instructions (Signed)
Imaging was reassuring.  Starting you on antibiotics please take as prescribed.  Recommend rinse out the wound and change out the dressings 2-3 times daily for next 7 days.  You do have some nailbed involvement this will heal on its own, may take 3 to 6 months.  Recommend over-the-counter pain medications like Tylenol every 6 as needed for pain management.  You must follow-up about the vaccine status of the dog that bit you, if the dog is not vaccinated you have to continue with the rabies vaccination schedule which I have provided you.  You may go to urgent care, come back to this department, your PCP for these vaccines.  Come back to the emergency department if you develop chest pain, shortness of breath, severe abdominal pain, uncontrolled nausea, vomiting, diarrhea.

## 2020-11-21 NOTE — ED Provider Notes (Signed)
[place] EMERGENCY DEPARTMENT Provider Note   CSN: [id number] Arrival date & time: 11/20/20  2157     History Chief Complaint  Patient presents with   Animal Bite    Kelsey Dougherty is a 25 y.o. female.  HPI  Patient with no significant medical history presents to the emergency department with chief complaint of a dog bite.  Patient states that she was trying to break up a fight between her dog and the neighbors dog.  Unfortunately while doing so the neighbors dog bit her right ring finger.  She also sustained a scratch on her left elbow and her inner thigh.  She states that she has some pain in her right index finger, she is able to stop the bleeding with direct pressure.  She denies  paresthesias or weakness in the finger, is able to move in all joints.  She is not immunocompromise, is up-to-date on her tetanus shots.  It is unknown whether or not this dog is up-to-date on its rabies vaccine.  She has no other complaints at this time.  She does not endorse fevers, chills, chest pain, shortness breath, worsening peripheral edema.  Past Medical History:  Diagnosis Date   Anxiety    Depression    Recurrent cold sores     Patient Active Problem List   Diagnosis Date Noted   Poor weight gain of pregnancy, second trimester 08/06/2020   Encounter for supervision of normal pregnancy in first trimester 06/06/2020    Past Surgical History:  Procedure Laterality Date   EYE SURGERY Right 2016   x3   RHINOPLASTY  2016   WISDOM TOOTH EXTRACTION  2017     OB History     Gravida  2   Para  1   Term  1   Preterm      AB      Living  1      SAB      IAB      Ectopic      Multiple      Live Births  1           Family History  Problem Relation Age of Onset   Hypertension Mother    Skin cancer Father     Social History   Tobacco Use   Smoking status: Never   Smokeless tobacco: Never  Vaping Use   Vaping Use: Never used  Substance Use  Topics   Alcohol use: Not Currently    Comment: occ, not since confirmed preg   Drug use: Not Currently    Home Medications Prior to Admission medications   Medication Sig Start Date End Date Taking? Authorizing Provider  amoxicillin-clavulanate (AUGMENTIN) 875-125 MG tablet Take 1 tablet by mouth every 12 (twelve) hours for 7 days. 11/21/20 11/28/20 Yes Marcello Fennel, PA-C  acyclovir (ZOVIRAX) 400 MG tablet Take 400 mg by mouth as needed. 08/12/20   [provider]  Blood Pressure Monitoring (BLOOD PRESSURE KIT) DEVI 1 kit by Does not apply route once a week. 06/06/20   Chancy Milroy, MD  docusate sodium (COLACE) 100 MG capsule Take 1 capsule (100 mg total) by mouth 2 (two) times daily as needed. 10/01/20   Constant, Peggy, MD  Prenatal MV & Min w/FA-DHA (PRENATAL GUMMIES PO) Take by mouth.    [provider]    Allergies    Patient has no known allergies.  Review of Systems   Review of Systems  Constitutional:  Negative for chills and fever.  HENT:  Negative for congestion.   Respiratory:  Negative for shortness of breath.   Cardiovascular:  Negative for chest pain.  Gastrointestinal:  Negative for abdominal pain.  Genitourinary:  Negative for enuresis.  Musculoskeletal:  Negative for back pain.  Skin:  Positive for wound. Negative for rash.  Neurological:  Negative for dizziness.  Hematological:  Does not bruise/bleed easily.   Physical Exam Updated Vital Signs BP 133/90 (BP Location: Right Arm)   Pulse 78   Temp 98.9 F (37.2 C) (Oral)   Resp 14   LMP 03/16/2020   SpO2 99%   Physical Exam Vitals and nursing note reviewed.  Constitutional:      General: She is not in acute distress.    Appearance: She is not ill-appearing.  HENT:     Head: Normocephalic and atraumatic.     Nose: No congestion.  Eyes:     Conjunctiva/sclera: Conjunctivae normal.  Cardiovascular:     Rate and Rhythm: Normal rate and regular rhythm.     Pulses: Normal  pulses.     Heart sounds: No murmur heard.   No friction rub. No gallop.  Musculoskeletal:     Comments: Right hand is visualized she has a noted puncture wound with nailbed involvement of the right ring finger.  Is hemodynamically stable, there is no foreign bodies present, she has full range of motion in all joints in her ring finger, neurovascular is fully intact.  Please see picture for full detail.  Skin:    General: Skin is warm and dry.     Comments: Patient is a small abrasion on her left elbow very superficial, hemodynamically stable, she also has a scratch on the right inner thigh again very superficial hemodynamically stable.  Neurological:     Mental Status: She is alert.  Psychiatric:        Mood and Affect: Mood normal.     ED Results / Procedures / Treatments   Labs (all labs ordered are listed, but only abnormal results are displayed) Labs Reviewed - No data to display  EKG None  Radiology DG Finger Ring Right  Result Date: 11/20/2020 CLINICAL DATA:  Dog bite to distal finger EXAM: RIGHT RING FINGER 2+V COMPARISON:  None. FINDINGS: No fracture or dislocation is seen. The joint spaces are preserved. Soft tissue swelling/laceration along the medial aspect of the 4th distal phalanx. No radiopaque foreign body is seen. IMPRESSION: Soft tissue swelling/laceration along the medial aspect of the 4th distal phalanx. No fracture, dislocation, or radiopaque foreign body is seen. Electronically Signed   By: Julian Hy M.D.   On: 11/20/2020 23:20    Procedures Procedures   Medications Ordered in ED Medications  rabies immune globulin (HYPERAB/KEDRAB) injection 1,425 Units (has no administration in time range)  rabies vaccine (RABAVERT) injection 1 mL (has no administration in time range)    ED Course  I have reviewed the triage vital signs and the nursing notes.  Pertinent labs & imaging results that were available during my care of the patient were reviewed by me  and considered in my medical decision making (see chart for details).    MDM Rules/Calculators/A&P                          Initial impression-Presents with a dog bite wound.  She is alert, does not appear acute stress, vital signs reassuring.  Will obtain imaging for rule out of  fracture.  Work-up-DG of right ring finger is negative for acute findings.  Reassessment-patient is updated on imaging, wound was thoroughly irrigated, and nonadhesive dressing was placed.  Rabies vaccine was given with immunoglobulin and she is agreeable for discharge at this time.  Rule out-   Low suspicion for fracture or dislocation as x-ray does not feel any significant findings. low suspicion for ligament or tendon damage as area was palpated no gross defects noted, they had full range of motion as well as 5/5 strength.  Low suspicion for compartment syndrome as area was palpated it was soft to the touch, neurovascular fully intact.  Patient does have some nailbed involvement but the majority of nail is intact will defer on nailbed removal and/or subungual hematoma drainage as it is draining on its own.  There is a slight abrasion on the lateral aspect of the right fingernail will defer suturing as I do not want to increase infection risks, will allow to heal via second intention.   Plan-  Dog bite-patient will be started on antibiotics, rabies vaccine and immunoglobulin have been given, will have her follow-up with rabies vaccination status with owner.  Given strict return precautions.  Vital signs have remained stable, no indication for hospital admission.  Patient given at home care as well strict return precautions.  Patient verbalized that they understood agreed to said plan.  Final Clinical Impression(s) / ED Diagnoses Final diagnoses:  Animal bite    Rx / DC Orders ED Discharge Orders          Ordered    amoxicillin-clavulanate (AUGMENTIN) 875-125 MG tablet  Every 12 hours        11/21/20 0131              Marcello Fennel, PA-C 11/21/20 0134    Fatima Blank, MD 11/21/20 [phone]

## 2020-11-22 ENCOUNTER — Telehealth: Payer: Self-pay

## 2020-11-22 NOTE — Telephone Encounter (Signed)
Transition Care Management Unsuccessful Follow-up Telephone Call  Date of discharge and from where:  11/21/2020 [facility]  Attempts:  1st Attempt  Reason for unsuccessful TCM follow-up call:  Left voice message    

## 2020-11-23 ENCOUNTER — Ambulatory Visit
Admission: EM | Admit: 2020-11-23 | Discharge: 2020-11-23 | Disposition: A | Discharge status: Home / Self Care | Payer: BC Managed Care – PPO | Attending: Emergency Medicine | Admitting: Emergency Medicine

## 2020-11-23 DIAGNOSIS — Z203 Contact with and (suspected) exposure to rabies: Secondary | ICD-10-CM | POA: Diagnosis not present

## 2020-11-23 DIAGNOSIS — Z23 Encounter for immunization: Secondary | ICD-10-CM

## 2020-11-23 MED ORDER — RABIES VACCINE, PCEC IM SUSR
1.0000 mL | Freq: Once | INTRAMUSCULAR | Status: AC
  Administered 2020-11-23: 1 mL via INTRAMUSCULAR

## 2020-11-23 NOTE — Discharge Instructions (Signed)
Patient presents for 2nd rabies vaccination.

## 2020-11-23 NOTE — ED Triage Notes (Signed)
Pt presents today for her 2nd rabies vaccination. Pt denies any issues to the site of dog bite. Pt states she did not have any complications with the last rabies vaccination.  Provider aware of pregnancy.

## 2020-11-23 NOTE — Telephone Encounter (Signed)
Transition Care Management Unsuccessful Follow-up Telephone Call  Date of discharge and from where:  11/21/2020 from [facility]  Attempts:  2nd Attempt  Reason for unsuccessful TCM follow-up call:  Left voice message

## 2020-11-26 ENCOUNTER — Ambulatory Visit (INDEPENDENT_AMBULATORY_CARE_PROVIDER_SITE_OTHER): Payer: BC Managed Care – PPO | Admitting: Advanced Practice Midwife

## 2020-11-26 ENCOUNTER — Other Ambulatory Visit: Payer: Self-pay

## 2020-11-26 ENCOUNTER — Other Ambulatory Visit (HOSPITAL_COMMUNITY)
Admission: RE | Admit: 2020-11-26 | Discharge: 2020-11-26 | Disposition: A | Discharge status: Home / Self Care | Payer: BC Managed Care – PPO | Source: Ambulatory Visit | Attending: Advanced Practice Midwife | Admitting: Advanced Practice Midwife

## 2020-11-26 VITALS — BP 123/87 | HR 90 | Wt 157.6 lb

## 2020-11-26 DIAGNOSIS — W540XXA Bitten by dog, initial encounter: Secondary | ICD-10-CM | POA: Insufficient documentation

## 2020-11-26 DIAGNOSIS — Z3403 Encounter for supervision of normal first pregnancy, third trimester: Secondary | ICD-10-CM | POA: Diagnosis not present

## 2020-11-26 DIAGNOSIS — O26843 Uterine size-date discrepancy, third trimester: Secondary | ICD-10-CM

## 2020-11-26 DIAGNOSIS — O2613 Low weight gain in pregnancy, third trimester: Secondary | ICD-10-CM

## 2020-11-26 DIAGNOSIS — W540XXD Bitten by dog, subsequent encounter: Secondary | ICD-10-CM

## 2020-11-26 DIAGNOSIS — Z3A36 36 weeks gestation of pregnancy: Secondary | ICD-10-CM

## 2020-11-26 LAB — OB RESULTS CONSOLE GC/CHLAMYDIA: Gonorrhea: NEGATIVE

## 2020-11-26 NOTE — Telephone Encounter (Signed)
Transition Care Management Unsuccessful Follow-up Telephone Call  Date of discharge and from where:  11/21/2020 from [facility]  Attempts:  3rd Attempt  Reason for unsuccessful TCM follow-up call:  Unable to reach patient

## 2020-11-26 NOTE — Progress Notes (Signed)
   PRENATAL VISIT NOTE  Subjective:  Kelsey Dougherty is a 25 y.o. G2P1001 at [redacted]w[redacted]d being seen today for ongoing prenatal care.  She is currently monitored for the following issues for this low-risk pregnancy and has Encounter for supervision of normal pregnancy in first trimester; Poor weight gain of pregnancy, second trimester; and Dog bite on their problem list.  Patient reports no complaints.  Contractions: Not present. Vag. Bleeding: None.  Movement: Present. Denies leaking of fluid.   The following portions of the patient's history were reviewed and updated as appropriate: allergies, current medications, past family history, past medical history, past social history, past surgical history and problem list.   Objective:   Vitals:   11/26/20 1455  BP: 123/87  Pulse: 90  Weight: 157 lb 9.6 oz (71.5 kg)    Fetal Status: Fetal Heart Rate (bpm): 129 Fundal Height: 34 cm Movement: Present  Presentation: Vertex  General:  Alert, oriented and cooperative. Patient is in no acute distress.  Skin: Skin is warm and dry. No rash noted.   Cardiovascular: Normal heart rate noted  Respiratory: Normal respiratory effort, no problems with respiration noted  Abdomen: Soft, gravid, appropriate for gestational age.  Pain/Pressure: Absent     Pelvic: Cervical exam performed in the presence of a chaperone Dilation: 1 Effacement (%): 40 Station: -3  Extremities: Normal range of motion.  Edema: Trace  Mental Status: Normal mood and affect. Normal behavior. Normal judgment and thought content.   Assessment and Plan:  Pregnancy: G2P1001 at [redacted]w[redacted]d 1. Encounter for supervision of normal first pregnancy in third trimester --Anticipatory guidance about next visits/weeks of pregnancy given. --Next visit in 1 week  --GBS, GCC collected today  2. [redacted] weeks gestation of pregnancy   3. Poor weight gain of pregnancy, third trimester --Discussed dietary intake with pt, encouraged increased protein and calories.   Pt reports eating regularly, snacking in between meals.   --[place] next week for growth, last [place] with EFW 15%  4. Uterine size date discrepancy pregnancy, third trimester --EFW 15%tile   5. Dog bite, subsequent encounter --ED visit on 11/23/20 for dog bite. Pt was tx with augmentin course and received rabies immunoglobulin and vaccine as postexposure prophylaxis  Preterm labor symptoms and general obstetric precautions including but not limited to vaginal bleeding, contractions, leaking of fluid and fetal movement were reviewed in detail with the patient. Please refer to After Visit Summary for other counseling recommendations.   No follow-ups on file.  Future Appointments  Date Time Provider Department Center  11/27/2020  2:30 PM [facility] PROVIDER [facility] None  12/03/2020  2:45 PM [facility] NURSE [facility]  12/03/2020  3:00 PM [facility] US1 [facility]  12/03/2020  4:10 PM Leftwich-Kirby, Wilmer Floor, CNM [facility] None    Sharen Counter, CNM

## 2020-11-27 ENCOUNTER — Ambulatory Visit
Admission: EM | Admit: 2020-11-27 | Discharge: 2020-11-27 | Disposition: A | Discharge status: Home / Self Care | Payer: BC Managed Care – PPO | Attending: Emergency Medicine | Admitting: Emergency Medicine

## 2020-11-27 ENCOUNTER — Other Ambulatory Visit: Payer: Self-pay | Admitting: Advanced Practice Midwife

## 2020-11-27 VITALS — BP 101/71 | HR 93 | Temp 98.2°F | Resp 18

## 2020-11-27 DIAGNOSIS — Z203 Contact with and (suspected) exposure to rabies: Secondary | ICD-10-CM

## 2020-11-27 DIAGNOSIS — B3731 Acute candidiasis of vulva and vagina: Secondary | ICD-10-CM

## 2020-11-27 LAB — CERVICOVAGINAL ANCILLARY ONLY
Bacterial Vaginitis (gardnerella): NEGATIVE
Candida Glabrata: NEGATIVE
Candida Vaginitis: POSITIVE — AB
Chlamydia: NEGATIVE
Comment: NEGATIVE
Comment: NEGATIVE
Comment: NEGATIVE
Comment: NEGATIVE
Comment: NEGATIVE
Comment: NORMAL
Neisseria Gonorrhea: NEGATIVE
Trichomonas: NEGATIVE

## 2020-11-27 MED ORDER — FLUCONAZOLE 150 MG PO TABS: ORAL_TABLET | ORAL | 0 refills | Status: DC

## 2020-11-27 MED ORDER — RABIES VACCINE, PCEC IM SUSR
1.0000 mL | Freq: Once | INTRAMUSCULAR | Status: AC
  Administered 2020-11-27: 1 mL via INTRAMUSCULAR

## 2020-11-27 NOTE — ED Triage Notes (Signed)
Pt presents today for her 3rd rabies vaccination. Pt denies any issues to the site of dog bite. Pt states she did not have any complications with the last rabies vaccination.

## 2020-11-27 NOTE — Discharge Instructions (Signed)
Patient seen today for her 3rd rabies vaccination

## 2020-11-30 LAB — CULTURE, BETA STREP (GROUP B ONLY): Strep Gp B Culture: NEGATIVE

## 2020-12-03 ENCOUNTER — Other Ambulatory Visit: Payer: Self-pay

## 2020-12-03 ENCOUNTER — Ambulatory Visit: Payer: BC Managed Care – PPO

## 2020-12-03 ENCOUNTER — Encounter: Payer: Self-pay | Admitting: *Deleted

## 2020-12-03 ENCOUNTER — Ambulatory Visit (INDEPENDENT_AMBULATORY_CARE_PROVIDER_SITE_OTHER): Payer: BC Managed Care – PPO | Admitting: Advanced Practice Midwife

## 2020-12-03 ENCOUNTER — Ambulatory Visit: Payer: BC Managed Care – PPO | Admitting: *Deleted

## 2020-12-03 ENCOUNTER — Ambulatory Visit: Payer: BC Managed Care – PPO | Attending: Obstetrics and Gynecology

## 2020-12-03 ENCOUNTER — Encounter: Payer: Self-pay | Admitting: Advanced Practice Midwife

## 2020-12-03 VITALS — BP 115/68 | HR 68

## 2020-12-03 VITALS — BP 119/77 | HR 104 | Wt 161.0 lb

## 2020-12-03 DIAGNOSIS — Z3A37 37 weeks gestation of pregnancy: Secondary | ICD-10-CM | POA: Diagnosis not present

## 2020-12-03 DIAGNOSIS — Z3481 Encounter for supervision of other normal pregnancy, first trimester: Secondary | ICD-10-CM | POA: Insufficient documentation

## 2020-12-03 DIAGNOSIS — O365930 Maternal care for other known or suspected poor fetal growth, third trimester, not applicable or unspecified: Secondary | ICD-10-CM | POA: Insufficient documentation

## 2020-12-03 DIAGNOSIS — O26843 Uterine size-date discrepancy, third trimester: Secondary | ICD-10-CM | POA: Diagnosis not present

## 2020-12-03 DIAGNOSIS — O365990 Maternal care for other known or suspected poor fetal growth, unspecified trimester, not applicable or unspecified: Secondary | ICD-10-CM | POA: Diagnosis present

## 2020-12-03 DIAGNOSIS — Z3403 Encounter for supervision of normal first pregnancy, third trimester: Secondary | ICD-10-CM

## 2020-12-03 NOTE — Progress Notes (Signed)
   PRENATAL VISIT NOTE  Subjective:  Kelsey Dougherty is a 25 y.o. G2P1001 at [redacted]w[redacted]d being seen today for ongoing prenatal care.  She is currently monitored for the following issues for this low-risk pregnancy and has Encounter for supervision of normal pregnancy in first trimester; Poor weight gain of pregnancy, second trimester; and Dog bite on their problem list.  Patient reports occasional contractions.  Contractions: Not present. Vag. Bleeding: None.  Movement: Present. Denies leaking of fluid.   The following portions of the patient's history were reviewed and updated as appropriate: allergies, current medications, past family history, past medical history, past social history, past surgical history and problem list.   Objective:   Vitals:   12/03/20 1615  BP: 119/77  Pulse: (!) 104  Weight: 161 lb (73 kg)    Fetal Status: Fetal Heart Rate (bpm): 147 Fundal Height: 34 cm Movement: Present     General:  Alert, oriented and cooperative. Patient is in no acute distress.  Skin: Skin is warm and dry. No rash noted.   Cardiovascular: Normal heart rate noted  Respiratory: Normal respiratory effort, no problems with respiration noted  Abdomen: Soft, gravid, appropriate for gestational age.  Pain/Pressure: Present     Pelvic: Cervical exam deferred        Extremities: Normal range of motion.  Edema: None  Mental Status: Normal mood and affect. Normal behavior. Normal judgment and thought content.   Assessment and Plan:  Pregnancy: G2P1001 at [redacted]w[redacted]d 1. Encounter for supervision of normal first pregnancy in third trimester --Anticipatory guidance about next visits/weeks of pregnancy given. --Next appt in 1 week  2. Uterine size date discrepancy pregnancy, third trimester --[place] today shows 25%tile, good growth --FH remains behind at 34 today  3. [redacted] weeks gestation of pregnancy   Term labor symptoms and general obstetric precautions including but not limited to vaginal bleeding,  contractions, leaking of fluid and fetal movement were reviewed in detail with the patient. Please refer to After Visit Summary for other counseling recommendations.   Return in about 1 week (around 12/10/2020).  Future Appointments  Date Time Provider Department Center  12/04/2020  2:30 PM [facility] PROVIDER [facility] None  12/12/2020  2:30 PM Nugent, Odie Sera, NP [facility] None    Sharen Counter, CNM

## 2020-12-04 ENCOUNTER — Ambulatory Visit
Admission: EM | Admit: 2020-12-04 | Discharge: 2020-12-04 | Disposition: A | Discharge status: Home / Self Care | Payer: BC Managed Care – PPO | Attending: Emergency Medicine | Admitting: Emergency Medicine

## 2020-12-04 DIAGNOSIS — Z203 Contact with and (suspected) exposure to rabies: Secondary | ICD-10-CM

## 2020-12-04 DIAGNOSIS — Z23 Encounter for immunization: Secondary | ICD-10-CM

## 2020-12-04 MED ORDER — RABIES VACCINE, PCEC IM SUSR
1.0000 mL | Freq: Once | INTRAMUSCULAR | Status: AC
  Administered 2020-12-04: 1 mL via INTRAMUSCULAR

## 2020-12-04 NOTE — ED Triage Notes (Signed)
Pt presents today for her 4th rabies vaccination. Pt denies any issues to the site of dog bite. Pt states she did not have any complications with the last rabies vaccination.

## 2020-12-08 ENCOUNTER — Other Ambulatory Visit: Payer: Self-pay | Admitting: Advanced Practice Midwife

## 2020-12-10 ENCOUNTER — Ambulatory Visit: Payer: BC Managed Care – PPO

## 2020-12-12 ENCOUNTER — Other Ambulatory Visit: Payer: Self-pay

## 2020-12-12 ENCOUNTER — Ambulatory Visit (INDEPENDENT_AMBULATORY_CARE_PROVIDER_SITE_OTHER): Payer: BC Managed Care – PPO | Admitting: Women's Health

## 2020-12-12 ENCOUNTER — Encounter: Payer: BC Managed Care – PPO | Admitting: Women's Health

## 2020-12-12 VITALS — BP 115/79 | HR 76 | Wt 161.0 lb

## 2020-12-12 DIAGNOSIS — Z3A38 38 weeks gestation of pregnancy: Secondary | ICD-10-CM

## 2020-12-12 DIAGNOSIS — Z3481 Encounter for supervision of other normal pregnancy, first trimester: Secondary | ICD-10-CM

## 2020-12-12 NOTE — Progress Notes (Signed)
Subjective:  Kelsey Dougherty is a 25 y.o. G2P1001 at [redacted]w[redacted]d being seen today for ongoing prenatal care.  She is currently monitored for the following issues for this low-risk pregnancy and has Encounter for supervision of normal pregnancy in first trimester; Poor weight gain of pregnancy, second trimester; and Dog bite on their problem list.  Patient reports no complaints.  Contractions: Not present. Vag. Bleeding: None.  Movement: Present. Denies leaking of fluid.   The following portions of the patient's history were reviewed and updated as appropriate: allergies, current medications, past family history, past medical history, past social history, past surgical history and problem list. Problem list updated.  Objective:   Vitals:   12/12/20 1441  BP: 115/79  Pulse: 76  Weight: 161 lb (73 kg)    Fetal Status: Fetal Heart Rate (bpm): 151   Movement: Present     General:  Alert, oriented and cooperative. Patient is in no acute distress.  Skin: Skin is warm and dry. No rash noted.   Cardiovascular: Normal heart rate noted  Respiratory: Normal respiratory effort, no problems with respiration noted  Abdomen: Soft, gravid, appropriate for gestational age. Pain/Pressure: Present     Pelvic: Vag. Bleeding: None     Cervical exam deferred        Extremities: Normal range of motion.  Edema: None  Mental Status: Normal mood and affect. Normal behavior. Normal judgment and thought content.   Urinalysis:      Assessment and Plan:  Pregnancy: G2P1001 at [redacted]w[redacted]d  1. Encounter for supervision of other normal pregnancy in first trimester -no concerns, IOL planned 12/14/2020  2. [redacted] weeks gestation of pregnancy PHQ9 SCORE ONLY 12/03/2020 06/06/2020  PHQ-9 Total Score 0 3   GAD 7 : Generalized Anxiety Score 12/12/2020 06/06/2020  Nervous, Anxious, on Edge 1 0  Control/stop worrying 0 0  Worry too much - different things 0 0  Trouble relaxing 0 0  Restless 0 0  Easily annoyed or irritable 0 0   Afraid - awful might happen 0 1  Total GAD 7 Score 1 1  Anxiety Difficulty Not difficult at all -    Term labor symptoms and general obstetric precautions including but not limited to vaginal bleeding, contractions, leaking of fluid and fetal movement were reviewed in detail with the patient. I discussed the assessment and treatment plan with the patient. The patient was provided an opportunity to ask questions and all were answered. The patient agreed with the plan and demonstrated an understanding of the instructions. The patient was advised to call back or seek an in-person office evaluation/go to MAU at [facility] for any urgent or concerning symptoms. Please refer to After Visit Summary for other counseling recommendations.  Return in about 6 weeks (around 01/23/2021) for PP Visit, IOL scheduled 12/14/2020.   Jahmar Mckelvy, Odie Sera, NP

## 2020-12-12 NOTE — Patient Instructions (Signed)
Maternity Assessment Unit (MAU)  The Maternity Assessment Unit (MAU) is located at the [facility] at [facility]. The address is: [address] [place], [place], [place] [address]. Please see map below for additional directions.    The Maternity Assessment Unit is designed to help you during your pregnancy, and for up to 6 weeks after delivery, with any pregnancy- or postpartum-related emergencies, if you think you are in labor, or if your water has broken. For example, if you experience nausea and vomiting, vaginal bleeding, severe abdominal or pelvic pain, elevated blood pressure or other problems related to your pregnancy or postpartum time, please come to the Maternity Assessment Unit for assistance.       Signs and Symptoms of Labor Labor is the body's natural process of moving the baby and the placenta out of the uterus. The process of labor usually starts when the baby is full-term, between 56 and 41 weeks of pregnancy. Signs and symptoms that you are close to going into labor As your body prepares for labor and the birth of your baby, you may notice the following symptoms in the weeks and days before true labor starts: Passing a small amount of thick, bloody mucus from your vagina. This is called normal bloody show or losing your mucus plug. This may happen more than a week before labor begins, or right before labor begins, as the opening of the cervix starts to widen (dilate). For some women, the entire mucus plug passes at once. For others, pieces of the mucus plug may gradually pass over several days. Your baby moving (dropping) lower in your pelvis to get into position for birth (lightening). When this happens, you may feel more pressure on your bladder and pelvic bone and less pressure on your ribs. This may make it easier to breathe. It may also cause you to need to urinate more often and have problems with bowel movements. Having "practice  contractions," also called Braxton Hicks contractions or false labor. These occur at irregular (unevenly spaced) intervals that are more than 10 minutes apart. False labor contractions are common after exercise or sexual activity. They will stop if you change position, rest, or drink fluids. These contractions are usually mild and do not get stronger over time. They may feel like: A backache or back pain. Mild cramps, similar to menstrual cramps. Tightening or pressure in your abdomen. Other early symptoms include: Nausea or loss of appetite. Diarrhea. Having a sudden burst of energy, or feeling very tired. Mood changes. Having trouble sleeping. Signs and symptoms that labor has begun Signs that you are in labor may include: Having contractions that come at regular (evenly spaced) intervals and increase in intensity. This may feel like more intense tightening or pressure in your abdomen that moves to your back. Contractions may also feel like rhythmic pain in your upper thighs or back that comes and goes at regular intervals. If you are delivering for the first time, this change in intensity of contractions often occurs at a more gradual pace. If you have given birth before, you may notice a more rapid progression of contraction changes. Feeling pressure in the vaginal area. Your water breaking (rupture of membranes). This is when the sac of fluid that surrounds your baby breaks. Fluid leaking from your vagina may be clear or blood-tinged. Labor usually starts within 24 hours of your water breaking, but it may take longer to begin. Some people may feel a sudden gush of fluid;  others may notice repeatedly damp underwear. Follow these instructions at home:  When labor starts, or if your water breaks, call your health care provider or nurse care line. Based on your situation, they will determine when you should go in for an exam. During early labor, you may be able to rest and manage symptoms at  home. Some strategies to try at home include: Breathing and relaxation techniques. Taking a warm bath or shower. Listening to music. Using a heating pad on the lower back for pain. If directed, apply heat to the area as often as told by your health care provider. Use the heat source that your health care provider recommends, such as a moist heat pack or a heating pad. Place a towel between your skin and the heat source. Leave the heat on for 20-30 minutes. Remove the heat if your skin turns bright red. This is especially important if you are unable to feel pain, heat, or cold. You have a greater risk of getting burned. Contact a health care provider if: Your labor has started. Your water breaks. You have nausea, vomiting, or diarrhea. Get help right away if: You have painful, regular contractions that are 5 minutes apart or less. Labor starts before you are [redacted] weeks along in your pregnancy. You have a fever. You have bright red blood coming from your vagina. You do not feel your baby moving. You have a severe headache with or without vision problems. You have chest pain or shortness of breath. These symptoms may represent a serious problem that is an emergency. Do not wait to see if the symptoms will go away. Get medical help right away. Call your local emergency services (911 in the [place] Do not drive yourself to the hospital. Summary Labor is your body's natural process of moving your baby and the placenta out of your uterus. The process of labor usually starts when your baby is full-term, between 63 and 40 weeks of pregnancy. When labor starts, or if your water breaks, call your health care provider or nurse care line. Based on your situation, they will determine when you should go in for an exam. This information is not intended to replace advice given to you by your health care provider. Make sure you discuss any questions you have with your health care provider. Document Revised:  05/15/2020 Document Reviewed: 05/15/2020 Elsevier Patient Education  2022 [employer].

## 2020-12-13 ENCOUNTER — Encounter (HOSPITAL_COMMUNITY): Payer: Self-pay | Admitting: Obstetrics and Gynecology

## 2020-12-13 ENCOUNTER — Inpatient Hospital Stay (HOSPITAL_COMMUNITY): Payer: BC Managed Care – PPO | Admitting: Anesthesiology

## 2020-12-13 ENCOUNTER — Inpatient Hospital Stay (HOSPITAL_COMMUNITY)
Admission: AD | Admit: 2020-12-13 | Discharge: 2020-12-15 | DRG: 807 | Disposition: A | Discharge status: Home / Self Care | Payer: BC Managed Care – PPO | Attending: Obstetrics and Gynecology | Admitting: Obstetrics and Gynecology

## 2020-12-13 ENCOUNTER — Other Ambulatory Visit: Payer: Self-pay

## 2020-12-13 ENCOUNTER — Inpatient Hospital Stay (HOSPITAL_COMMUNITY)
Admission: AD | Admit: 2020-12-13 | Discharge: 2020-12-13 | Disposition: A | Discharge status: Home / Self Care | Payer: BC Managed Care – PPO | Source: Home / Self Care | Attending: Obstetrics & Gynecology | Admitting: Obstetrics & Gynecology

## 2020-12-13 DIAGNOSIS — Z3A38 38 weeks gestation of pregnancy: Secondary | ICD-10-CM | POA: Insufficient documentation

## 2020-12-13 DIAGNOSIS — O479 False labor, unspecified: Secondary | ICD-10-CM | POA: Insufficient documentation

## 2020-12-13 DIAGNOSIS — Z3491 Encounter for supervision of normal pregnancy, unspecified, first trimester: Secondary | ICD-10-CM

## 2020-12-13 DIAGNOSIS — Z20822 Contact with and (suspected) exposure to covid-19: Secondary | ICD-10-CM | POA: Diagnosis present

## 2020-12-13 DIAGNOSIS — O26893 Other specified pregnancy related conditions, third trimester: Secondary | ICD-10-CM | POA: Diagnosis present

## 2020-12-13 DIAGNOSIS — O2612 Low weight gain in pregnancy, second trimester: Secondary | ICD-10-CM | POA: Diagnosis present

## 2020-12-13 DIAGNOSIS — Z30430 Encounter for insertion of intrauterine contraceptive device: Secondary | ICD-10-CM

## 2020-12-13 DIAGNOSIS — Z3481 Encounter for supervision of other normal pregnancy, first trimester: Secondary | ICD-10-CM

## 2020-12-13 DIAGNOSIS — Z3490 Encounter for supervision of normal pregnancy, unspecified, unspecified trimester: Secondary | ICD-10-CM | POA: Insufficient documentation

## 2020-12-13 LAB — CBC
HCT: 36.1 % (ref 36.0–46.0)
Hemoglobin: 11.8 g/dL — ABNORMAL LOW (ref 12.0–15.0)
MCH: 28.9 pg (ref 26.0–34.0)
MCHC: 32.7 g/dL (ref 30.0–36.0)
MCV: 88.5 fL (ref 80.0–100.0)
Platelets: 201 10*3/uL (ref 150–400)
RBC: 4.08 MIL/uL (ref 3.87–5.11)
RDW: 12.7 % (ref 11.5–15.5)
WBC: 13.5 10*3/uL — ABNORMAL HIGH (ref 4.0–10.5)
nRBC: 0 % (ref 0.0–0.2)

## 2020-12-13 LAB — TYPE AND SCREEN
ABO/RH(D): O POS
Antibody Screen: NEGATIVE

## 2020-12-13 LAB — RESP PANEL BY RT-PCR (FLU A&B, COVID) ARPGX2
Influenza A by PCR: NEGATIVE
Influenza B by PCR: NEGATIVE
SARS Coronavirus 2 by RT PCR: NEGATIVE

## 2020-12-13 LAB — POCT FERN TEST: POCT Fern Test: POSITIVE

## 2020-12-13 MED ORDER — DIPHENHYDRAMINE HCL 50 MG/ML IJ SOLN: 12.5000 mg | INTRAMUSCULAR | Status: DC | PRN

## 2020-12-13 MED ORDER — EPHEDRINE 5 MG/ML INJ: 10.0000 mg | INTRAVENOUS | Status: DC | PRN

## 2020-12-13 MED ORDER — LIDOCAINE HCL (PF) 1 % IJ SOLN
INTRAMUSCULAR | Status: DC | PRN
  Administered 2020-12-13 (×2): 5 mL via EPIDURAL

## 2020-12-13 MED ORDER — PHENYLEPHRINE 40 MCG/ML (10ML) SYRINGE FOR IV PUSH (FOR BLOOD PRESSURE SUPPORT): 80.0000 ug | PREFILLED_SYRINGE | INTRAVENOUS | Status: DC | PRN

## 2020-12-13 MED ORDER — ONDANSETRON HCL 4 MG/2ML IJ SOLN: 4.0000 mg | Freq: Four times a day (QID) | INTRAMUSCULAR | Status: DC | PRN

## 2020-12-13 MED ORDER — PHENYLEPHRINE 40 MCG/ML (10ML) SYRINGE FOR IV PUSH (FOR BLOOD PRESSURE SUPPORT)
80.0000 ug | PREFILLED_SYRINGE | INTRAVENOUS | Status: DC | PRN
  Filled 2020-12-13: qty 10

## 2020-12-13 MED ORDER — OXYCODONE-ACETAMINOPHEN 5-325 MG PO TABS: 2.0000 | ORAL_TABLET | ORAL | Status: DC | PRN

## 2020-12-13 MED ORDER — FENTANYL CITRATE (PF) 100 MCG/2ML IJ SOLN
100.0000 ug | INTRAMUSCULAR | Status: DC | PRN
  Administered 2020-12-13: 100 ug via INTRAVENOUS
  Filled 2020-12-13: qty 2

## 2020-12-13 MED ORDER — LACTATED RINGERS IV SOLN: 500.0000 mL | Freq: Once | INTRAVENOUS | Status: DC

## 2020-12-13 MED ORDER — SOD CITRATE-CITRIC ACID 500-334 MG/5ML PO SOLN: 30.0000 mL | ORAL | Status: DC | PRN

## 2020-12-13 MED ORDER — LIDOCAINE HCL (PF) 1 % IJ SOLN: 30.0000 mL | INTRAMUSCULAR | Status: DC | PRN

## 2020-12-13 MED ORDER — LACTATED RINGERS IV SOLN: INTRAVENOUS | Status: DC

## 2020-12-13 MED ORDER — LACTATED RINGERS IV SOLN
500.0000 mL | INTRAVENOUS | Status: DC | PRN
Start: 2020-12-13 — End: 2020-12-13

## 2020-12-13 MED ORDER — OXYTOCIN-SODIUM CHLORIDE 30-0.9 UT/500ML-% IV SOLN
2.5000 [IU]/h | INTRAVENOUS | Status: DC
  Administered 2020-12-13: 2.5 [IU]/h via INTRAVENOUS
  Filled 2020-12-13: qty 500

## 2020-12-13 MED ORDER — OXYCODONE-ACETAMINOPHEN 5-325 MG PO TABS: 1.0000 | ORAL_TABLET | ORAL | Status: DC | PRN

## 2020-12-13 MED ORDER — BUPIVACAINE HCL (PF) 0.25 % IJ SOLN
INTRAMUSCULAR | Status: DC | PRN
  Administered 2020-12-13: 10 mL via EPIDURAL

## 2020-12-13 MED ORDER — FENTANYL-BUPIVACAINE-NACL 0.5-0.125-0.9 MG/250ML-% EP SOLN
12.0000 mL/h | EPIDURAL | Status: DC | PRN
  Administered 2020-12-13: 12 mL/h via EPIDURAL
  Filled 2020-12-13: qty 250

## 2020-12-13 MED ORDER — TERBUTALINE SULFATE 1 MG/ML IJ SOLN: 0.2500 mg | Freq: Once | INTRAMUSCULAR | Status: DC | PRN

## 2020-12-13 MED ORDER — MISOPROSTOL 25 MCG QUARTER TABLET: 25.0000 ug | ORAL_TABLET | ORAL | Status: DC | PRN

## 2020-12-13 MED ORDER — ACETAMINOPHEN 325 MG PO TABS: 650.0000 mg | ORAL_TABLET | ORAL | Status: DC | PRN

## 2020-12-13 MED ORDER — LEVONORGESTREL 20.1 MCG/DAY IU IUD
1.0000 | INTRAUTERINE_SYSTEM | Freq: Once | INTRAUTERINE | Status: AC
  Administered 2020-12-13: 1 via INTRAUTERINE
  Filled 2020-12-13: qty 1

## 2020-12-13 MED ORDER — OXYTOCIN BOLUS FROM INFUSION
333.0000 mL | Freq: Once | INTRAVENOUS | Status: AC
  Administered 2020-12-13: 333 mL via INTRAVENOUS

## 2020-12-13 NOTE — Discharge Summary (Signed)
Postpartum Discharge Summary      Patient Name: Kelsey Dougherty DOB: 01-06-1996 MRN: [id number]  Date of admission: 12/13/2020 Delivery date:12/13/2020  Delivering provider: Christin Fudge  Date of discharge: 12/15/2020  Admitting diagnosis: Encounter for elective induction of labor [Z34.90] Intrauterine pregnancy: [redacted]w[redacted]d    Secondary diagnosis:  Active Problems:   Encounter for supervision of normal pregnancy in first trimester   Poor weight gain of pregnancy, second trimester  Additional problems: none    Discharge diagnosis: Term Pregnancy Delivered                                              Post partum procedures:Liletta IUD insertion  Augmentation: AROM of forebag Complications: None  Hospital course: Onset of Labor With Vaginal Delivery      25y.o. yo G2P1001 at 23w6das admitted in Active Labor on 12/13/2020. Patient had an uncomplicated labor course as follows:  Membrane Rupture Time/Date: 6:39 PM ,12/13/2020   Delivery Method:Vaginal, Spontaneous  Episiotomy: None  Lacerations:  None  Patient had an uncomplicated postpartum course.  She is ambulating, tolerating a regular diet, passing flatus, and urinating well. Patient is discharged home in stable condition on 12/15/20.  Newborn Data: Birth date:12/13/2020  Birth time:9:56 PM  Gender:Female  Living status:Living  Apgars:9 ,9  Weight:2855 g (6lb 4.7oz)  Magnesium Sulfate received: No BMZ received: No Rhophylac:N/A MMR:N/A T-DaP:Given prenatally Flu: Yes Transfusion:No  Physical exam  Vitals:   12/14/20 0915 12/14/20 1210 12/14/20 2116 12/15/20 0540  BP: 111/75 110/81 115/79 121/85  Pulse: 73 70 81 74  Resp: '16 16 16 16  ' Temp: 98.1 F (36.7 C) 98 F (36.7 C) 98.1 F (36.7 C) 98 F (36.7 C)  TempSrc: Oral Oral Oral Oral  SpO2:   100% 100%  Weight:      Height:       General: alert and cooperative Lochia: appropriate Uterine Fundus: firm Incision: N/A DVT Evaluation: No evidence of  DVT seen on physical exam. Labs: Lab Results  Component Value Date   WBC 13.5 (H) 12/13/2020   HGB 11.8 (L) 12/13/2020   HCT 36.1 12/13/2020   MCV 88.5 12/13/2020   PLT 201 12/13/2020   CMP Latest Ref Rng & Units 08/10/2020  Glucose 70 - 99 mg/dL 109(H)  BUN 6 - 20 mg/dL 6  Creatinine 0.44 - 1.00 mg/dL 0.66  Sodium 135 - 145 mmol/L 135  Potassium 3.5 - 5.1 mmol/L 3.3(L)  Chloride 98 - 111 mmol/L 107  CO2 22 - 32 mmol/L 20(L)  Calcium 8.9 - 10.3 mg/dL 8.3(L)   EdFlavia Shippercore: Edinburgh Postnatal Depression Scale Screening Tool 12/14/2020  I have been able to laugh and see the funny side of things. (No Data)     After visit meds:  Allergies as of 12/15/2020   No Known Allergies      Medication List     STOP taking these medications    acyclovir 400 MG tablet Commonly known as: ZOVIRAX   Blood Pressure Kit Devi       TAKE these medications    docusate sodium 100 MG capsule Commonly known as: COLACE Take 1 capsule (100 mg total) by mouth 2 (two) times daily as needed.   ibuprofen 600 MG tablet Commonly known as: ADVIL Take 1 tablet (600 mg total) by mouth every 6 (six) hours as needed.  PRENATAL GUMMIES PO Take by mouth.         Discharge home in stable condition Infant Feeding: Breast Infant Disposition:home with mother Discharge instruction: per After Visit Summary and Postpartum booklet. Activity: Advance as tolerated. Pelvic rest for 6 weeks.  Diet: routine diet Future Appointments: Future Appointments  Date Time Provider [place]  01/28/2021  2:10 PM Leftwich-Kirby, Kathie Dike, CNM [facility] None   Follow up Visit:   Please schedule this patient for a In person postpartum visit in 4 weeks with the following provider: Any provider. Additional Postpartum F/U:  Low risk pregnancy complicated by:  Delivery mode:  Vaginal, Spontaneous  Anticipated Birth Control:  PP IUD placed   12/15/2020 Myrtis Ser, CNM 9:24 AM

## 2020-12-13 NOTE — MAU Note (Signed)
Pt reports having ctxs since 2300 but became stronger at 0300. Denies LOF or VB. 1cm at 36wks. Reports good FM

## 2020-12-13 NOTE — Progress Notes (Addendum)
  Post-Placental IUD Insertion Procedure Note  Patient identified, informed consent signed prior to delivery, signed copy in chart, time out was performed.    Vaginal, labial and perineal areas thoroughly inspected for lacerations. 0 degree laceration identified - Liletta  - - IUD inserted with inserter per manufacturer's instructions.    Strings trimmed to the level of the introitus. Patient tolerated procedure well.  Patient given post procedure instructions and IUD care card with expiration date.  Patient is asked to keep IUD strings tucked in her vagina until her postpartum follow up visit in 4-6 weeks. Patient advised to abstain from sexual intercourse and pulling on strings before her follow-up visit. Patient verbalized an understanding of the plan of care and agrees.

## 2020-12-13 NOTE — H&P (Signed)
Kelsey Dougherty is a 25 y.o. female presenting for ROM at 11am today.  Has been contracting intermittently since yesterday.  However, ctx have gotten much stronger in the past several hours.  LEaking clear fluid.  OB History     Gravida  2   Para  1   Term  1   Preterm      AB      Living  1      SAB      IAB      Ectopic      Multiple      Live Births  1          Past Medical History:  Diagnosis Date   Anxiety    Depression    Recurrent cold sores    Past Surgical History:  Procedure Laterality Date   EYE SURGERY Right 2016   x3   RHINOPLASTY  2016   WISDOM TOOTH EXTRACTION  2017   Family History: family history includes Hypertension in her mother; Skin cancer in her father. Social History:  reports that she has never smoked. She has never used smokeless tobacco. She reports that she does not currently use alcohol. She reports that she does not currently use drugs.     Maternal Diabetes: No Genetic Screening: Normal Maternal Ultrasounds/Referrals: Normal Fetal Ultrasounds or other Referrals:  None Maternal Substance Abuse:  No Significant Maternal Medications:  None Significant Maternal Lab Results:  Group B Strep negative Other Comments:  None  Review of Systems History Dilation: 4.5 Effacement (%): 80 Station: -2 Exam by:: Drenda Freeze CNM Blood pressure 130/79, pulse 76, temperature 99.7 F (37.6 C), temperature source Oral, resp. rate 16, height 5\' 4"  (1.626 m), weight 73.9 kg, last menstrual period 03/16/2020, SpO2 100 %. Maternal Exam:  Introitus: Normal vulva.  Physical Exam Constitutional:      Appearance: Normal appearance.  Cardiovascular:     Rate and Rhythm: Normal rate and regular rhythm.  Pulmonary:     Effort: Pulmonary effort is normal.  Abdominal:     Palpations: Abdomen is soft.  Genitourinary:    General: Normal vulva.  Musculoskeletal:        General: No swelling. Normal range of motion.  Skin:    General: Skin is warm and  dry.  Neurological:     Mental Status: She is alert.  Psychiatric:        Mood and Affect: Mood normal.        Thought Content: Thought content normal.    Prenatal labs: ABO, Rh: --/--/O POS (12/01 1637) Antibody: NEG (12/01 1637) Rubella: 2.95 (05/31 1501) RPR: Non Reactive (09/19 1034)  HBsAg: Negative (05/31 1501)  HIV: Non Reactive (09/19 1034)  GBS: Negative/-- (11/14 1552)   Nursing Staff Provider  Office Location  [facility] Dating  LMP  Language  English Anatomy [birth year]2004  normal  Flu Vaccine  Given 10/29/20 Genetic/Carrier Screen  NIPS: Low risks AFP:   negative Horizon: neg  TDaP Vaccine   Given 10/15/20 Hgb A1C or  GTT Early  Third trimester nl 2 hour  COVID Vaccine Not vaccinated   LAB RESULTS   Rhogam  O+ Blood Type O/Positive/-- (05/31 1501)   Baby Feeding Plan Breast Antibody Negative (05/31 1501)  Contraception Pill Rubella 2.95 (05/31 1501)  Circumcision Yes if a boy RPR Non Reactive (09/19 1034)   Pediatrician  [facility] HBsAg Negative (05/31 1501)   Support Person [facility] HCVAb Negative (5/31 1501)  Prenatal Classes Info given  06/12/20 HIV Non Reactive (09/19 1034)     BTL Consent NA GBS Negative/-- (11/14 1552)negative  VBAC Consent NA Pap NILM/HPV+ 05/2020       BP Cuff Ordered 06/06/20 Waterbirth  [ ]  Class [ ]  Consent [ ]  CNM visit  PHQ9 & GAD7 [ X ] new OB [  ] 28 weeks  [x]  36 weeks   [x]  GAD  Induction  [ ]  Orders Entered [ ] Foley Y/N   Assessment/Plan: Expectant management as pt has had good cervical change since  She was checked yesterday. Plans epidural.  Open to augmentation if necessary.   Cresenzo-Dishmon 12/13/2020, 7:03 PM

## 2020-12-13 NOTE — MAU Note (Signed)
...  Kelsey Dougherty is a 25 y.o. at [redacted]w[redacted]d here in MAU reporting: CTX five minutes apart. Patient states she thinks her water broke an hour ago. She states she felt a trickle of fluids when she stood up and then went to use the restroom and when she sat down she felt a gush of fluids into the toilet. Clear fluids. +FM. No VB.  Sent home from MAU this morning around 1000 for minimal cervical change. GBS-. Induction of labor scheduled for tomorrow morning.  Lab orders placed from triage:  MAU Labor Eval

## 2020-12-13 NOTE — Anesthesia Preprocedure Evaluation (Signed)
Anesthesia Evaluation  Patient identified by MRN, date of birth, ID band Patient awake    Reviewed: Allergy & Precautions, H&P , NPO status , Patient's Chart, lab work & pertinent test results  History of Anesthesia Complications Negative for: history of anesthetic complications  Airway Mallampati: II  TM Distance: >3 FB Neck ROM: full    Dental no notable dental hx. (+) Teeth Intact   Pulmonary neg pulmonary ROS,    Pulmonary exam normal breath sounds clear to auscultation       Cardiovascular negative cardio ROS Normal cardiovascular exam Rhythm:regular Rate:Normal     Neuro/Psych PSYCHIATRIC DISORDERS Anxiety Depression negative neurological ROS     GI/Hepatic negative GI ROS, Neg liver ROS,   Endo/Other  negative endocrine ROS  Renal/GU negative Renal ROS  negative genitourinary   Musculoskeletal   Abdominal   Peds  Hematology negative hematology ROS (+)   Anesthesia Other Findings   Reproductive/Obstetrics (+) Pregnancy                             Anesthesia Physical Anesthesia Plan  ASA: 2  Anesthesia Plan: Epidural   Post-op Pain Management:    Induction:   PONV Risk Score and Plan:   Airway Management Planned:   Additional Equipment:   Intra-op Plan:   Post-operative Plan:   Informed Consent: I have reviewed the patients History and Physical, chart, labs and discussed the procedure including the risks, benefits and alternatives for the proposed anesthesia with the patient or authorized representative who has indicated his/her understanding and acceptance.       Plan Discussed with:   Anesthesia Plan Comments:         Anesthesia Quick Evaluation

## 2020-12-13 NOTE — H&P (Shared)
OBSTETRIC ADMISSION HISTORY AND PHYSICAL  Kelsey Dougherty is a 26 y.o. female G2P1001 with IUP at 86w6dby LMP presenting for SROM and early labor. She reports +FMs, no VB, no blurry vision, headaches or peripheral edema, and RUQ pain.  She plans on *** feeding. She request *** for birth control. She received her prenatal care at {Blank single:19197::"[facility]"}   Dating: By LMP --->  Estimated Date of Delivery: 12/21/20  Sono:    '@[redacted]w[redacted]d' , CWD, normal anatomy, cephalic presentation, posterior placental lie, 2845g, 24% EFW   Prenatal History/Complications:  Hx of dog bite in pregnancy, s/p rabies series and antibiotics  Past Medical History: Past Medical History:  Diagnosis Date   Anxiety    Depression    Recurrent cold sores     Past Surgical History: Past Surgical History:  Procedure Laterality Date   EYE SURGERY Right 2016   x3   RHINOPLASTY  2016   WISDOM TOOTH EXTRACTION  2017    Obstetrical History: OB History     Gravida  2   Para  1   Term  1   Preterm      AB      Living  1      SAB      IAB      Ectopic      Multiple      Live Births  1           Social History Social History   Socioeconomic History   Marital status: Single    Spouse name: Not on file   Number of children: 1   Years of education: college   Highest education level: Associate degree: academic program  Occupational History   Occupation: pPsychologist, sport and exercise Tobacco Use   Smoking status: Never   Smokeless tobacco: Never  Vaping Use   Vaping Use: Never used  Substance and Sexual Activity   Alcohol use: Not Currently    Comment: occ, not since confirmed preg   Drug use: Not Currently   Sexual activity: Yes    Partners: Male    Birth control/protection: None  Other Topics Concern   Not on file  Social History Narrative   Lives at home with boyfriend and toddler daughter. Pregnant now w/ EDD 12/21/20 (having girl).   Left-handed.   Occasional  caffeine.   Social Determinants of Health   Financial Resource Strain: Not on file  Food Insecurity: Not on file  Transportation Needs: Not on file  Physical Activity: Not on file  Stress: Not on file  Social Connections: Not on file    Family History: Family History  Problem Relation Age of Onset   Hypertension Mother    Skin cancer Father     Allergies: No Known Allergies  Medications Prior to Admission  Medication Sig Dispense Refill Last Dose   Prenatal MV & Min w/FA-DHA (PRENATAL GUMMIES PO) Take by mouth.   Past Week   acyclovir (ZOVIRAX) 400 MG tablet Take 400 mg by mouth as needed.      Blood Pressure Monitoring (BLOOD PRESSURE KIT) DEVI 1 kit by Does not apply route once a week. 1 each 0    docusate sodium (COLACE) 100 MG capsule Take 1 capsule (100 mg total) by mouth 2 (two) times daily as needed. 30 capsule 2      Review of Systems   All systems reviewed and negative except as stated in HPI  Blood pressure 131/88, pulse 75, temperature 99.4 F (37.4 C),  temperature source Oral, resp. rate 16, height '5\' 4"'  (1.626 m), weight 73.9 kg, last menstrual period 03/16/2020, SpO2 100 %. General appearance: alert Lungs: clear to auscultation bilaterally Heart: regular rate and rhythm Abdomen: soft, non-tender; bowel sounds normal Extremities: Homans sign is negative, no sign of DVT Presentation: cephalic Fetal monitoringBaseline: *** bpm, Variability: {fhr variability:31519}, Accelerations: {fhr accel present:31520}, and Decelerations: {FHR DECEL PRESENT:31526} Uterine activity{Uterine contractions:31516} Dilation: 3.5 Effacement (%): 80 Station: -2 Exam by:: Cloretta Ned, RN   Prenatal labs: ABO, Rh: --/--/O POS (12/01 1637) Antibody: NEG (12/01 1637) Rubella: 2.95 (05/31 1501) RPR: Non Reactive (09/19 1034)  HBsAg: Negative (05/31 1501)  HIV: Non Reactive (09/19 1034)  GBS: Negative/-- (11/14 1552)  2 hr Glucola negative Genetic screening  AFP neg, low  risk NIPS Anatomy US normal, femur length measuring short  Prenatal Transfer Tool  Maternal Diabetes: No Genetic Screening: Normal Maternal Ultrasounds/Referrals: Normal Fetal Ultrasounds or other Referrals:  None Maternal Substance Abuse:  No Significant Maternal Medications:  None Significant Maternal Lab Results: Group B Strep negative  Results for orders placed or performed during the hospital encounter of 12/13/20 (from the past 24 hour(s))  POCT fern test   Collection Time: 12/13/20  4:21 PM  Result Value Ref Range   POCT Fern Test Positive = ruptured amniotic membanes   Resp Panel by RT-PCR (Flu A&B, Covid) Nasopharyngeal Swab   Collection Time: 12/13/20  4:37 PM   Specimen: Nasopharyngeal Swab; Nasopharyngeal(NP) swabs in vial transport medium  Result Value Ref Range   SARS Coronavirus 2 by RT PCR NEGATIVE NEGATIVE   Influenza A by PCR NEGATIVE NEGATIVE   Influenza B by PCR NEGATIVE NEGATIVE  CBC   Collection Time: 12/13/20  4:37 PM  Result Value Ref Range   WBC 13.5 (H) 4.0 - 10.5 K/uL   RBC 4.08 3.87 - 5.11 MIL/uL   Hemoglobin 11.8 (L) 12.0 - 15.0 g/dL   HCT 36.1 36.0 - 46.0 %   MCV 88.5 80.0 - 100.0 fL   MCH 28.9 26.0 - 34.0 pg   MCHC 32.7 30.0 - 36.0 g/dL   RDW 12.7 11.5 - 15.5 %   Platelets 201 150 - 400 K/uL   nRBC 0.0 0.0 - 0.2 %  Type and screen   Collection Time: 12/13/20  4:37 PM  Result Value Ref Range   ABO/RH(D) O POS    Antibody Screen NEG    Sample Expiration      12/16/2020,2359 Performed at [facility], [address], [place], [place] [address]     Patient Active Problem List   Diagnosis Date Noted   Encounter for elective induction of labor 12/13/2020   Dog bite 11/26/2020   Poor weight gain of pregnancy, second trimester 08/06/2020   Encounter for supervision of normal pregnancy in first trimester 06/06/2020    Assessment/Plan:  Kelsey Dougherty is a 25 y.o. G2P1001 at 32w6dhere for***SROM/ early labor  #Labor:***Expectant  management until check at 2100 #Pain: *** #FWB: Cat I #ID:  GBS neg #MOF: Breast #MOC:***POPs #Circ:  N/A  ARenard Matter MD  12/13/2020, 5:50 PM

## 2020-12-13 NOTE — Lactation Note (Signed)
This note was copied from a baby's chart. Lactation Consultation Note  Patient Name: Kelsey Dougherty Today's Date: 12/13/2020 Reason for consult: L&D Initial assessment Age:23 hours P2, ETI female infant. LC entered the room, infant was cuing to breastfeed. Mom latched infant on her left breast using the football hold position, infant was on and off breast for 20 minutes. Mom knows to breastfeed infant according to primal cues, skin to skin. Mom knows to ask RN/LC for latch assistance in [facility] if needed.  LC congratulated parents on the birth of their daughter.   Maternal Data Does the patient have breastfeeding experience prior to this delivery?: Yes How long did the patient breastfeed?: Per mom, she breastfeed her two year old for 22 months  Feeding Mother's Current Feeding Choice: Breast Milk  LATCH Score Latch: Repeated attempts needed to sustain latch, nipple held in mouth throughout feeding, stimulation needed to elicit sucking reflex.  Audible Swallowing: A few with stimulation  Type of Nipple: Everted at rest and after stimulation  Comfort (Breast/Nipple): Soft / non-tender  Hold (Positioning): Assistance needed to correctly position infant at breast and maintain latch.  LATCH Score: 7   Lactation Tools Discussed/Used    Interventions Interventions: Skin to skin;Breast compression;Adjust position;Support pillows;Position options;Education  Discharge Pump: Personal WIC Program: Yes  Consult Status Consult Status: Follow-up from L&D    Danelle Earthly 12/13/2020, 10:48 PM

## 2020-12-13 NOTE — Anesthesia Procedure Notes (Addendum)
Epidural Patient location during procedure: OB Start time: 12/13/2020 7:41 PM End time: 12/13/2020 7:51 PM  Staffing Anesthesiologist: Leonides Grills, MD Performed: anesthesiologist   Preanesthetic Checklist Completed: patient identified, IV checked, site marked, risks and benefits discussed, monitors and equipment checked, pre-op evaluation and timeout performed  Epidural Patient position: sitting Prep: DuraPrep Patient monitoring: heart rate, cardiac monitor, continuous pulse ox and blood pressure Approach: midline Location: L4-L5 Injection technique: LOR air  Needle:  Needle type: Tuohy  Needle gauge: 17 G Needle length: 9 cm Needle insertion depth: 6 cm Catheter type: closed end flexible Catheter size: 19 Gauge Catheter at skin depth: 11 cm Test dose: negative and 1.5% lidocaine with Epi 1:200 K  Assessment Events: blood not aspirated, injection not painful, no injection resistance and negative IV test  Additional Notes Informed consent obtained prior to proceeding including risk of failure, 1% risk of PDPH, risk of minor discomfort and bruising. Discussed alternatives to epidural analgesia and patient desires to proceed.  Timeout performed pre-procedure verifying patient name, procedure, and platelet count.  Patient tolerated procedure well. Reason for block:procedure for pain

## 2020-12-13 NOTE — Anesthesia Procedure Notes (Addendum)
Epidural Patient location during procedure: OB Start time: 12/13/2020 9:00 PM End time: 12/13/2020 9:10 PM  Staffing Anesthesiologist: Leonides Grills, MD Performed: anesthesiologist   Preanesthetic Checklist Completed: patient identified, IV checked, site marked, risks and benefits discussed, monitors and equipment checked, pre-op evaluation and timeout performed  Epidural Patient position: sitting Prep: DuraPrep Patient monitoring: heart rate, cardiac monitor, continuous pulse ox and blood pressure Approach: midline Location: L3-L4 Injection technique: LOR air  Needle:  Needle type: Tuohy  Needle gauge: 17 G Needle length: 9 cm Needle insertion depth: 6 cm Catheter type: closed end flexible Catheter size: 19 Gauge Catheter at skin depth: 11 cm Test dose: negative and 1.5% lidocaine with Epi 1:200 K  Assessment Events: blood not aspirated, injection not painful, no injection resistance and negative IV test  Additional Notes Informed consent obtained prior to proceeding including risk of failure, 1% risk of PDPH, risk of minor discomfort and bruising. Discussed alternatives to epidural analgesia and patient desires to proceed.  Timeout performed pre-procedure verifying patient name, procedure, and platelet count.  Patient tolerated procedure well. Reason for block:procedure for pain

## 2020-12-14 ENCOUNTER — Encounter (HOSPITAL_COMMUNITY): Payer: Self-pay | Admitting: Family Medicine

## 2020-12-14 ENCOUNTER — Inpatient Hospital Stay (HOSPITAL_COMMUNITY)
Admission: AD | Admit: 2020-12-14 | Payer: BC Managed Care – PPO | Source: Home / Self Care | Admitting: Obstetrics & Gynecology

## 2020-12-14 ENCOUNTER — Inpatient Hospital Stay (HOSPITAL_COMMUNITY): Payer: BC Managed Care – PPO

## 2020-12-14 LAB — RPR: RPR Ser Ql: NONREACTIVE

## 2020-12-14 MED ORDER — ACETAMINOPHEN 325 MG PO TABS
650.0000 mg | ORAL_TABLET | ORAL | Status: DC | PRN
  Administered 2020-12-14 – 2020-12-15 (×5): 650 mg via ORAL
  Filled 2020-12-14 (×5): qty 2

## 2020-12-14 MED ORDER — BISACODYL 10 MG RE SUPP: 10.0000 mg | Freq: Every day | RECTAL | Status: DC | PRN

## 2020-12-14 MED ORDER — DIBUCAINE (PERIANAL) 1 % EX OINT: 1.0000 "application " | TOPICAL_OINTMENT | CUTANEOUS | Status: DC | PRN

## 2020-12-14 MED ORDER — PRENATAL MULTIVITAMIN CH
1.0000 | ORAL_TABLET | Freq: Every day | ORAL | Status: DC
  Administered 2020-12-14 – 2020-12-15 (×2): 1 via ORAL
  Filled 2020-12-14 (×2): qty 1

## 2020-12-14 MED ORDER — METHYLERGONOVINE MALEATE 0.2 MG/ML IJ SOLN: 0.2000 mg | INTRAMUSCULAR | Status: DC | PRN

## 2020-12-14 MED ORDER — IBUPROFEN 600 MG PO TABS
600.0000 mg | ORAL_TABLET | Freq: Four times a day (QID) | ORAL | Status: DC
  Administered 2020-12-14 – 2020-12-15 (×7): 600 mg via ORAL
  Filled 2020-12-14 (×7): qty 1

## 2020-12-14 MED ORDER — BENZOCAINE-MENTHOL 20-0.5 % EX AERO: 1.0000 "application " | INHALATION_SPRAY | CUTANEOUS | Status: DC | PRN

## 2020-12-14 MED ORDER — FLEET ENEMA 7-19 GM/118ML RE ENEM: 1.0000 | ENEMA | Freq: Every day | RECTAL | Status: DC | PRN

## 2020-12-14 MED ORDER — ONDANSETRON HCL 4 MG PO TABS: 4.0000 mg | ORAL_TABLET | ORAL | Status: DC | PRN

## 2020-12-14 MED ORDER — MEDROXYPROGESTERONE ACETATE 150 MG/ML IM SUSP: 150.0000 mg | INTRAMUSCULAR | Status: DC | PRN

## 2020-12-14 MED ORDER — SIMETHICONE 80 MG PO CHEW: 80.0000 mg | CHEWABLE_TABLET | ORAL | Status: DC | PRN

## 2020-12-14 MED ORDER — ONDANSETRON HCL 4 MG/2ML IJ SOLN: 4.0000 mg | INTRAMUSCULAR | Status: DC | PRN

## 2020-12-14 MED ORDER — WITCH HAZEL-GLYCERIN EX PADS: 1.0000 "application " | MEDICATED_PAD | CUTANEOUS | Status: DC | PRN

## 2020-12-14 MED ORDER — COCONUT OIL OIL: 1.0000 "application " | TOPICAL_OIL | Status: DC | PRN

## 2020-12-14 MED ORDER — FERROUS SULFATE 325 (65 FE) MG PO TABS
325.0000 mg | ORAL_TABLET | ORAL | Status: DC
  Administered 2020-12-14: 325 mg via ORAL
  Filled 2020-12-14: qty 1

## 2020-12-14 MED ORDER — MEASLES, MUMPS & RUBELLA VAC IJ SOLR: 0.5000 mL | Freq: Once | INTRAMUSCULAR | Status: DC

## 2020-12-14 MED ORDER — DIPHENHYDRAMINE HCL 25 MG PO CAPS: 25.0000 mg | ORAL_CAPSULE | Freq: Four times a day (QID) | ORAL | Status: DC | PRN

## 2020-12-14 MED ORDER — METHYLERGONOVINE MALEATE 0.2 MG PO TABS: 0.2000 mg | ORAL_TABLET | ORAL | Status: DC | PRN

## 2020-12-14 MED ORDER — DOCUSATE SODIUM 100 MG PO CAPS
100.0000 mg | ORAL_CAPSULE | Freq: Two times a day (BID) | ORAL | Status: DC
  Administered 2020-12-14 – 2020-12-15 (×4): 100 mg via ORAL
  Filled 2020-12-14 (×4): qty 1

## 2020-12-14 MED ORDER — TETANUS-DIPHTH-ACELL PERTUSSIS 5-2.5-18.5 LF-MCG/0.5 IM SUSY: 0.5000 mL | PREFILLED_SYRINGE | Freq: Once | INTRAMUSCULAR | Status: DC

## 2020-12-14 NOTE — Social Work (Addendum)
CSW received consult for hx of Anxiety and Depression.  CSW met with MOB to offer support and complete assessment.    CSW met with MOB at bedside and introduced CSW role. CSW observed MOB lying in the bed and FOB lying awake on the couch holding the infant. MOB presented calm, pleasant and  welcomed CSW to complete the assessment with FOB present. CSW inquired how MOB has felt since giving birth. MOB expressed feeling good with a good L&D experience. MOB shared during the pregnancy she experienced anxiety and depression. MOB reported she was diagnosed with depression and anxiety in 2017-2018. MOB reported her anxiety is triggered with the thoughts of death. MOB shard after the birth of her older daughter she constantly ruminating on death. 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 MOB reported she has not been to therapy but is open to trying it. MOB reported she tries to implement coping strategies like limiting her time on social media to decrease her exposure to stories about death. CSW praised MOB for her efforts. CSW provided education regarding the baby blues period vs. perinatal mood disorders, discussed treatment and gave resources for mental health follow up. CSW also recommended MOB complete a self-evaluation during the postpartum time period using the New Mom Checklist from Postpartum Progress and encouraged MOB to contact a medical professional if symptoms are noted at any time.MOB denied thoughts of harm to self and others. MOB identified, FOB, mom and family as supports.   MOB reported she has essential items for the infant including a bassinet where the infant will sleep. CSW provided review of Sudden Infant Death Syndrome (SIDS) precautions. MOB has chosen [facility] for infant's follow up care and will have transportation. CSW assessed MOB  for additional needs. MOB reported no further need  CSW identifies no further need for intervention and no barriers to discharge at this time.    Kathrin Greathouse, MSW, LCSW [facility] [place] Worker  [phone] 12/14/2020  2:04 PM

## 2020-12-14 NOTE — Progress Notes (Signed)
Post Partum Day 1 Subjective: no complaints, up ad lib, voiding and tolerating PO, small lochia, plans to breastfeed,  pp IUD placed  Objective: Blood pressure 107/76, pulse 78, temperature 98 F (36.7 C), temperature source Oral, resp. rate 16, height 5\' 4"  (1.626 m), weight 73.9 kg, last menstrual period 03/16/2020, SpO2 100 %, unknown if currently breastfeeding.  Physical Exam:  General: alert, cooperative and no distress Lochia:normal flow Chest: CTAB Heart: RRR no m/r/g Abdomen: +BS, soft, nontender,  Uterine Fundus: firm DVT Evaluation: No evidence of DVT seen on physical exam. Extremities: no edema  Recent Labs    12/13/20 1637  HGB 11.8*  HCT 36.1    Assessment/Plan: Plan for discharge tomorrow and Lactation consult   LOS: 1 day   14/01/22 12/14/2020, 8:02 AM

## 2020-12-14 NOTE — Lactation Note (Addendum)
This note was copied from a baby's chart. Lactation Consultation Note  Patient Name: Kelsey Dougherty Today's Date: 12/14/2020 Reason for consult: Initial assessment;Early term 37-38.6wks Age:23 hours Per mom, infant has been sleepy, she has made attempts to latch infant earlier, infant recently breastfed for 15 minutes at 1505 pm. LC did not observe latch at this time, infant asleep in Maternal Aunt's arm as LC was talking with mom. Mom feels confident with breastfeed , she is experienced mom , she breastfed 1st child for 2 years.  Mom knows to hand express and give infant back her colostrum if infant doesn't latch at the breast and do lots of skin to skin. Mom knows to breastfeed infant according to feeding cues, 8 to 12 + or more times within 24 hours. Mom knows to call RN/LC if she has questions, concerns or need latch assistance with infant. Mom made aware of O/P services, breastfeeding support groups, community resources, and our phone # for post-discharge questions.   Maternal Data Has patient been taught Hand Expression?: Yes Does the patient have breastfeeding experience prior to this delivery?: Yes How long did the patient breastfeed?: Per mom, she breastfeed her 1st child for 22 months.  Feeding Mother's Current Feeding Choice: Breast Milk  LATCH Score              Lactation Tools Discussed/Used    Interventions Interventions: Breast feeding basics reviewed;Skin to skin;Hand express;Expressed milk;Education;DEBP;LC Services brochure  Discharge Pump: Personal (Per mom she has DEBP that she brought to the hospital.) [facility] Program: Yes  Consult Status Consult Status: Follow-up Date: 12/15/20 Follow-up type: In-patient    Danelle Earthly 12/14/2020, 4:44 PM

## 2020-12-14 NOTE — Progress Notes (Incomplete)
POSTPARTUM PROGRESS NOTE  Post Partum Day 1  Subjective:  Analyse Kelsey Dougherty is a 25 y.o. O0H2122 s/p SVD at [redacted]w[redacted]d.  She reports she is doing well. No acute events overnight. She denies any problems with ambulating, voiding or po intake. Denies nausea or vomiting.  Pain is {DESC; WELL/MODERATELY/POORLY:30679} controlled.  Lochia is ***.  Objective: Blood pressure 107/76, pulse 78, temperature 98 F (36.7 C), temperature source Oral, resp. rate 16, height 5\' 4"  (1.626 m), weight 73.9 kg, last menstrual period 03/16/2020, SpO2 100 %, unknown if currently breastfeeding.  Physical Exam:  General: alert, cooperative and no distress Chest: no respiratory distress Heart:regular rate, distal pulses intact Abdomen: soft, nontender,  Uterine Fundus: firm, appropriately tender DVT Evaluation: No calf swelling or tenderness Extremities: *** edema Skin: warm, dry  Recent Labs    12/13/20 1637  HGB 11.8*  HCT 36.1    Assessment/Plan: Kelsey Dougherty is a 25 y.o. 30 s/p SVD at [redacted]w[redacted]d   PPD#1 - Doing well  Routine postpartum care *** Contraception: *** Feeding: Breast Dispo: Plan for discharge ***.   LOS: 1 day   [redacted]w[redacted]d, D.O. OB Fellow  12/14/2020, 7:20 AM

## 2020-12-15 MED ORDER — IBUPROFEN 600 MG PO TABS: 600.0000 mg | ORAL_TABLET | Freq: Four times a day (QID) | ORAL | 0 refills | Status: AC | PRN

## 2020-12-15 NOTE — Lactation Note (Signed)
This note was copied from a baby's chart. Lactation Consultation Note  Patient Name: Kelsey Dougherty Today's Date: 12/15/2020 Reason for consult: Follow-up assessment;Early term 37-38.6wks;Infant weight loss;Other (Comment);Nipple pain/trauma (5 % weight loss / LC reviewed doc flow sheets and updated. large wet and stool prior to latching/) Age:23 hours LC reviewed BF D/C teaching.  LC recommended to mom STS feedings . Alternating between at least 2 positions due to hx of plugged ducts.  Mom aware of the [facility] resources after D/C.   Maternal Data Has patient been taught Hand Expression?: Yes  Feeding Mother's Current Feeding Choice: Breast Milk  LATCH Score Latch: Grasps breast easily, tongue down, lips flanged, rhythmical sucking.  Audible Swallowing: Spontaneous and intermittent  Type of Nipple: Everted at rest and after stimulation  Comfort (Breast/Nipple): Soft / non-tender  Hold (Positioning): Assistance needed to correctly position infant at breast and maintain latch.  LATCH Score: 9   Lactation Tools Discussed/Used    Interventions Interventions: Breast feeding basics reviewed;Assisted with latch;Skin to skin;Breast massage;Support pillows;Position options;Breast compression;Adjust position  Discharge Discharge Education: Engorgement and breast care;Warning signs for feeding baby Pump: Personal;Manual;DEBP  Consult Status Consult Status: Complete Date: 12/15/20    Kathrin Greathouse 12/15/2020, 9:21 AM

## 2020-12-17 ENCOUNTER — Telehealth: Payer: Self-pay

## 2020-12-17 NOTE — Telephone Encounter (Signed)
Transition Care Management Unsuccessful Follow-up Telephone Call  Date of discharge and from where:  12/15/2020 from [facility].  Attempts:  1st Attempt  Reason for unsuccessful TCM follow-up call:  Left voice message

## 2020-12-18 NOTE — Telephone Encounter (Signed)
Transition Care Management Unsuccessful Follow-up Telephone Call  Date of discharge and from where:  12/15/2020 from [facility]  Attempts:  2nd Attempt  Reason for unsuccessful TCM follow-up call:  Left voice message

## 2020-12-19 NOTE — Telephone Encounter (Signed)
Transition Care Management Unsuccessful Follow-up Telephone Call  Date of discharge and from where:  12/15/2020 from [facility]  Attempts:  3rd Attempt  Reason for unsuccessful TCM follow-up call:  Unable to reach patient

## 2020-12-23 NOTE — Anesthesia Postprocedure Evaluation (Signed)
Anesthesia Post Note  Patient: Camera operator) Performed: AN AD HOC LABOR EPIDURAL     Patient location during evaluation: Mother Baby Anesthesia Type: Epidural Level of consciousness: awake Pain management: pain level controlled Vital Signs Assessment: post-procedure vital signs reviewed and stable Respiratory status: spontaneous breathing, nonlabored ventilation and respiratory function stable Cardiovascular status: stable Postop Assessment: no headache, no backache and epidural receding Anesthetic complications: no   No notable events documented.  Last Vitals: There were no vitals filed for this visit.  Last Pain: There were no vitals filed for this visit. Pain Goal:                   Kelsey Dougherty

## 2020-12-27 ENCOUNTER — Telehealth (HOSPITAL_COMMUNITY): Payer: Self-pay | Admitting: *Deleted

## 2020-12-27 NOTE — Telephone Encounter (Signed)
Phone voicemail message left to return nurse call.  Duffy Rhody, RN 12-27-2020 at 4:10pm

## 2021-01-14 ENCOUNTER — Encounter: Payer: Self-pay | Admitting: Advanced Practice Midwife

## 2021-01-24 ENCOUNTER — Ambulatory Visit: Payer: BC Managed Care – PPO

## 2021-01-28 ENCOUNTER — Ambulatory Visit (INDEPENDENT_AMBULATORY_CARE_PROVIDER_SITE_OTHER): Payer: BC Managed Care – PPO | Admitting: Advanced Practice Midwife

## 2021-01-28 ENCOUNTER — Encounter: Payer: Self-pay | Admitting: Advanced Practice Midwife

## 2021-01-28 ENCOUNTER — Other Ambulatory Visit: Payer: Self-pay

## 2021-01-28 ENCOUNTER — Ambulatory Visit: Payer: BC Managed Care – PPO | Admitting: Advanced Practice Midwife

## 2021-01-28 VITALS — Wt 147.2 lb

## 2021-01-28 DIAGNOSIS — F418 Other specified anxiety disorders: Secondary | ICD-10-CM

## 2021-01-28 DIAGNOSIS — O99345 Other mental disorders complicating the puerperium: Secondary | ICD-10-CM | POA: Diagnosis not present

## 2021-01-28 MED ORDER — BUSPIRONE HCL 5 MG PO TABS: 5.0000 mg | ORAL_TABLET | Freq: Three times a day (TID) | ORAL | 2 refills | Status: DC

## 2021-01-28 NOTE — Progress Notes (Signed)
Post Partum Visit Note  Kelsey Dougherty is a 26 y.o. G21P2002 female who presents for a postpartum visit. She is 6 weeks postpartum following a normal spontaneous vaginal delivery.  I have fully reviewed the prenatal and intrapartum course. The delivery was at [redacted]w[redacted]d gestational weeks.  Anesthesia: epidural. Postpartum course has been. Baby is doing well. Baby is feeding by breast. Bleeding brown. Bowel function is normal. Bladder function is normal. Patient is not sexually active. Contraception method is IUD. Postpartum depression screening: EPDS=8    The pregnancy intention screening data noted above was reviewed. Potential methods of contraception were discussed. The patient elected to proceed with No data recorded.   Edinburgh Postnatal Depression Scale - 01/28/21 1419       Edinburgh Postnatal Depression Scale:  In the Past 7 Days   I have been able to laugh and see the funny side of things. 0    I have looked forward with enjoyment to things. 0    I have blamed myself unnecessarily when things went wrong. 0    I have been anxious or worried for no good reason. 3    I have felt scared or panicky for no good reason. 3    Things have been getting on top of me. 2    I have been so unhappy that I have had difficulty sleeping. 0    I have felt sad or miserable. 0    I have been so unhappy that I have been crying. 0    The thought of harming myself has occurred to me. 0    Edinburgh Postnatal Depression Scale Total 8             Health Maintenance Due  Topic Date Due   COVID-19 Vaccine (1) Never done   HPV VACCINES (1 - 2-dose series) Never done    The following portions of the patient's history were reviewed and updated as appropriate: allergies, current medications, past family history, past medical history, past social history, past surgical history, and problem list.  Review of Systems Pertinent items noted in HPI and remainder of comprehensive ROS otherwise  negative.  Objective:  Wt 147 lb 3.2 oz (66.8 kg)    LMP 03/16/2020    BMI 25.27 kg/m   VS reviewed, nursing note reviewed,  Constitutional: well developed, well nourished, no distress HEENT: normocephalic CV: normal rate Pulm/chest wall: normal effort Breast Exam:  right breast with small lump on upper inner portion, c/w plugged duct, left breast wnl Abdomen: soft Neuro: alert and oriented x 3 Skin: warm, dry Psych: affect normal Pelvic exam: Cervix pink, visually closed, without lesion, IUD string ~ 7-8 cm in length, external genitals wnl  Assessment:    1. Postpartum anxiety --Pt with negative thoughts, no thoughts of harming herself or others --Discussed medications, therapy options --Pt has taken Zoloft before, does not like antidepressant medications --Plan to try counseling and spot treat anxiety with low dose of Buspar --F/U in 1 month  - Ambulatory referral to Integrated Behavioral Health - busPIRone (BUSPAR) 5 MG tablet; Take 1 tablet (5 mg total) by mouth 3 (three) times daily.  Dispense: 30 tablet; Refill: 2  2. Postpartum examination following vaginal delivery --Doing well, bonding well with baby, good support at home --Has PP anxiety, similar to how she felt after first baby   Plan:   Essential components of care per ACOG recommendations:  1.  Mood and well being: Patient with positive depression screening today. Reviewed  local resources for support.  - Patient tobacco use? No.   - hx of drug use? No.    2. Infant care and feeding:  -Patient currently breastmilk feeding? Yes. Discussed returning to work and pumping. Reviewed importance of draining breast regularly to support lactation.  -Social determinants of health (SDOH) reviewed in EPIC. No concerns    3. Sexuality, contraception and birth spacing - Patient does not want a pregnancy in the next year.  --IUD placed in hospital, strings cut to 1-2 cm from cervical os today   4. Sleep and  fatigue -Encouraged family/partner/community support of 4 hrs of uninterrupted sleep to help with mood and fatigue  5. Physical Recovery  - Discussed patients delivery and complications. She describes her labor as good. - Patient had a Vaginal, no problems at delivery. Patient had no laceration. Perineal healing reviewed. Patient expressed understanding - Patient has urinary incontinence? No. - Patient is safe to resume physical and sexual activity  6.  Health Maintenance - HM due items addressed Yes - Last pap smear  Diagnosis  Date Value Ref Range Status  06/12/2020   Final   - Negative for intraepithelial lesion or malignancy (NILM)   Pap smear not done at today's visit.  -Breast Cancer screening indicated? No.   7. Chronic Disease/Pregnancy Condition follow up: None  - PCP follow up  Fatima Blank, [place] [facility] [place]

## 2021-02-01 ENCOUNTER — Institutional Professional Consult (permissible substitution): Payer: BC Managed Care – PPO | Admitting: Licensed Clinical Social Worker

## 2021-02-04 ENCOUNTER — Ambulatory Visit: Payer: BC Managed Care – PPO | Admitting: Licensed Clinical Social Worker

## 2021-02-05 NOTE — Progress Notes (Signed)
Pt called to report BP 125/85 on home cuff on 01/28/21, the day of her postpartum visit.  F/U appointment scheduled for 02/26/21.

## 2021-02-26 ENCOUNTER — Encounter: Payer: Self-pay | Admitting: Advanced Practice Midwife

## 2021-02-26 ENCOUNTER — Telehealth (INDEPENDENT_AMBULATORY_CARE_PROVIDER_SITE_OTHER): Payer: BC Managed Care – PPO | Admitting: Advanced Practice Midwife

## 2021-02-26 DIAGNOSIS — F418 Other specified anxiety disorders: Secondary | ICD-10-CM

## 2021-02-26 DIAGNOSIS — Z3A00 Weeks of gestation of pregnancy not specified: Secondary | ICD-10-CM

## 2021-02-26 DIAGNOSIS — O99345 Other mental disorders complicating the puerperium: Secondary | ICD-10-CM | POA: Insufficient documentation

## 2021-02-26 MED ORDER — BUSPIRONE HCL 5 MG PO TABS: 5.0000 mg | ORAL_TABLET | Freq: Three times a day (TID) | ORAL | 10 refills | Status: DC | PRN

## 2021-02-26 NOTE — Progress Notes (Signed)
GYNECOLOGY VIRTUAL VISIT ENCOUNTER NOTE  [facility]   Patient location: Home  I connected with Kelsey Dougherty on 02/26/21 at  1:50 PM EST by MyChart Video Encounter and verified that I am speaking with the correct person using two identifiers.   I discussed the limitations, risks, security and privacy concerns of performing an evaluation and management service virtually and the availability of in person appointments. I also discussed with the patient that there may be a patient responsible charge related to this service. The patient expressed understanding and agreed to proceed.   History:  Kelsey Dougherty is a 26 y.o. [phone] female being evaluated today for postpartum depression/anxiety.  She was referred to integrated [facility] and prescribed Buspar for PRN use for anxiety on 01/28/21 at 6 weeks postpartum.  She denies any abnormal vaginal discharge, bleeding, pelvic pain or other concerns.      Upstream - 02/26/21 1349       Pregnancy Intention Screening   Does the patient want to become pregnant in the next year? No    Does the patient's partner want to become pregnant in the next year? No    Would the patient like to discuss contraceptive options today? No      Contraception Wrap Up   Current Method IUD or IUS    End Method IUD or IUS            The pregnancy intention screening data noted above was reviewed. Potential methods of contraception were discussed. The patient elected to proceed with IUD or IUS.   Edinburgh Postnatal Depression Scale - 02/26/21 1347       Edinburgh Postnatal Depression Scale:  In the Past 7 Days   I have been able to laugh and see the funny side of things. 0    I have looked forward with enjoyment to things. 0    I have blamed myself unnecessarily when things went wrong. 0    I have been anxious or worried for no good reason. 3    I have felt scared or panicky for no good reason. 3    Things have been  getting on top of me. 0    I have been so unhappy that I have had difficulty sleeping. 0    I have felt sad or miserable. 0    I have been so unhappy that I have been crying. 0    The thought of harming myself has occurred to me. 0    Edinburgh Postnatal Depression Scale Total 6              Past Medical History:  Diagnosis Date   Anxiety    Depression    Recurrent cold sores    Past Surgical History:  Procedure Laterality Date   EYE SURGERY Right 2016   x3   RHINOPLASTY  2016   WISDOM TOOTH EXTRACTION  2017   The following portions of the patient's history were reviewed and updated as appropriate: allergies, current medications, past family history, past medical history, past social history, past surgical history and problem list.   Health Maintenance:  Normal pap  06/12/20.   Review of Systems:  Pertinent items noted in HPI and remainder of comprehensive ROS otherwise negative.  Physical Exam:   General:  Alert, oriented and cooperative. Patient appears to be in no acute distress.  Mental Status: Normal mood and affect. Normal behavior. Normal judgment and thought content.  Respiratory: Normal respiratory effort, no problems with respiration noted  Rest of physical exam deferred due to type of encounter  Labs and Imaging No results found for this or any previous visit (from the past 336 hour(s)). No results found.     Assessment and Plan:     1. Postpartum anxiety --Pt took Buspar x 1 and it seemed to help, then travelled and can't find the medicine.  She also needs to reschedule with Sue Lush. --F/U virtually with integrated BH --Rx for Buspar renewed --Encouraged pt to continue self-care, anxiety reduction techniques at home --F/U in 1 year for annual exam or sooner as needed  - busPIRone (BUSPAR) 5 MG tablet; Take 1 tablet (5 mg total) by mouth 3 (three) times daily as needed.  Dispense: 60 tablet; Refill: 10       I discussed the assessment and treatment  plan with the patient. The patient was provided an opportunity to ask questions and all were answered. The patient agreed with the plan and demonstrated an understanding of the instructions.   The patient was advised to call back or seek an in-person evaluation/go to the ED if the symptoms worsen or if the condition fails to improve as anticipated.  I provided 10 minutes of face-to-face time during this encounter.   Sharen Counter, CNM [facility] [employer], [facility]

## 2021-03-07 ENCOUNTER — Encounter: Payer: BC Managed Care – PPO | Admitting: Licensed Clinical Social Worker

## 2021-03-08 ENCOUNTER — Encounter: Payer: BC Managed Care – PPO | Admitting: Licensed Clinical Social Worker

## 2021-03-28 ENCOUNTER — Ambulatory Visit (INDEPENDENT_AMBULATORY_CARE_PROVIDER_SITE_OTHER): Payer: 59 | Admitting: Licensed Clinical Social Worker

## 2021-03-28 DIAGNOSIS — F418 Other specified anxiety disorders: Secondary | ICD-10-CM

## 2021-03-28 DIAGNOSIS — O99345 Other mental disorders complicating the puerperium: Secondary | ICD-10-CM | POA: Diagnosis not present

## 2021-04-01 ENCOUNTER — Other Ambulatory Visit: Payer: Self-pay | Admitting: Advanced Practice Midwife

## 2021-04-01 DIAGNOSIS — F418 Other specified anxiety disorders: Secondary | ICD-10-CM

## 2021-04-01 MED ORDER — BUSPIRONE HCL 5 MG PO TABS: 5.0000 mg | ORAL_TABLET | Freq: Three times a day (TID) | ORAL | 10 refills | Status: AC | PRN

## 2021-04-01 NOTE — Progress Notes (Signed)
Pt spoke with Lynnea Ferrier, LCW, today and is moving out of state. She requested a refill on Buspar until she gets established in her new location.  Rx for Buspar with refills sent to pt [facility] pharmacy.   ?

## 2021-04-01 NOTE — BH Specialist Note (Signed)
Integrated Behavioral Health via Telemedicine Visit ? ?04/01/2021 ?Rhyleigh Murgia ?[id number] ? ?Number of [facility] Clinician visits: 1 ?Session Start time:  3:30pm ?Session End time: 4:10pm ?Total time in minutes: 40 mins via mychart video  ? ?Referring Provider: Elizabeth Palau  ?Patient/Family location: Home  ?[facility] location: [facility]  ?All persons participating in visit: Pt S Bieri and LCSW A.Linton Rump  ?Types of Service: Individual psychotherapy and Video visit ? ?I connected with Cheyan Downen and/or Naydelin Gilpin's n/a via  Telephone or Geologist, engineering  (Video is Caregility application) and verified that I am speaking with the correct person using two identifiers. Discussed confidentiality: Yes  ? ?I discussed the limitations of telemedicine and the availability of in person appointments.  Discussed there is a possibility of technology failure and discussed alternative modes of communication if that failure occurs. ? ?I discussed that engaging in this telemedicine visit, they consent to the provision of behavioral healthcare and the services will be billed under their insurance. ? ?Patient and/or legal guardian expressed understanding and consented to Telemedicine visit: Yes  ? ?Presenting Concerns: ?Patient and/or family reports the following symptoms/concerns: postpartum anxiety ?Duration of problem: approx 3 months; Severity of problem: mild ? ?Patient and/or Family's Strengths/Protective Factors: ?Concrete supports in place (healthy food, safe environments, etc.) ? ?Goals Addressed: ?Patient will: ? Reduce symptoms of: anxiety  ? Increase knowledge and/or ability of: coping skills and stress reduction  ? Demonstrate ability to: Increase healthy adjustment to current life circumstances ? ?Progress towards Goals: ?Ongoing ? ?Interventions: ?Interventions utilized:  Supportive Counseling ?Standardized Assessments completed: Edinburgh Postnatal Depression ? ?Patient  and/or Family Response: Ms. Waag responded well to mychart visit  ? ?Assessment: ?Patient currently experiencing postpartum anxiety.  ? ?Patient may benefit from integrated behavioral health. ? ?Plan: ?Follow up with behavioral health clinician on : as needed  ?Behavioral recommendations: Engage in self care, listening to music, mindfulness and relaxation techniques ?Referral(s): [facility] (In Clinic) ? ?I discussed the assessment and treatment plan with the patient and/or parent/guardian. They were provided an opportunity to ask questions and all were answered. They agreed with the plan and demonstrated an understanding of the instructions. ?  ?They were advised to call back or seek an in-person evaluation if the symptoms worsen or if the condition fails to improve as anticipated. ? ?Lynnea Ferrier, LCSW ?

## 2021-04-08 ENCOUNTER — Telehealth: Payer: BC Managed Care – PPO | Admitting: Advanced Practice Midwife

## 2021-04-09 ENCOUNTER — Encounter: Payer: Self-pay | Admitting: Advanced Practice Midwife

## 2021-04-09 ENCOUNTER — Telehealth (INDEPENDENT_AMBULATORY_CARE_PROVIDER_SITE_OTHER): Payer: BC Managed Care – PPO | Admitting: Advanced Practice Midwife

## 2021-04-09 VITALS — BP 119/79 | HR 73

## 2021-04-09 DIAGNOSIS — F418 Other specified anxiety disorders: Secondary | ICD-10-CM

## 2021-04-09 DIAGNOSIS — O99345 Other mental disorders complicating the puerperium: Secondary | ICD-10-CM

## 2021-04-09 MED ORDER — ESCITALOPRAM OXALATE 10 MG PO TABS: 10.0000 mg | ORAL_TABLET | Freq: Every day | ORAL | 5 refills | Status: DC

## 2021-04-09 MED ORDER — ESCITALOPRAM OXALATE 5 MG PO TABS: 5.0000 mg | ORAL_TABLET | Freq: Every day | ORAL | 0 refills | Status: DC

## 2021-04-09 NOTE — Progress Notes (Signed)
Pt being seen virtually in regards to anxiety medication. Pt is on a PRN medication now and is requesting a daily medication. Pt states her anxiety has not improved and may have increased. ?

## 2021-04-09 NOTE — Progress Notes (Signed)
? ? ?  GYNECOLOGY VIRTUAL VISIT ENCOUNTER NOTE ? ?Provider location: [facility] [employer] [facility] [place]  ? ?Patient location: Home ? ?I connected with Kelsey Dougherty on 04/09/21 at  4:10 PM EDT by MyChart Video Encounter and verified that I am speaking with the correct person using two identifiers. ?  ?I discussed the limitations, risks, security and privacy concerns of performing an evaluation and management service virtually and the availability of in person appointments. I also discussed with the patient that there may be a patient responsible charge related to this service. The patient expressed understanding and agreed to proceed. ?  ?History:  ?Kelsey Dougherty is a 26 y.o. [phone] female being evaluated today for postpartum anxiety. She was prescribed Buspar on 01/28/21 and has been taking it PRN. She reports it was initially helping but it is often not enough and she feels like she needs a daily medication. She took a medicine postpartum after her first baby but it made her feel too sedated. She has heard good things about Lexapro.  She denies any thoughts of harming herself or others.   She denies any abnormal vaginal discharge, bleeding, pelvic pain or other concerns.   ?  ?  ?Past Medical History:  ?Diagnosis Date  ? Anxiety   ? Depression   ? Recurrent cold sores   ? ?Past Surgical History:  ?Procedure Laterality Date  ? EYE SURGERY Right 2016  ? x3  ? RHINOPLASTY  2016  ? WISDOM TOOTH EXTRACTION  2017  ? ?The following portions of the patient's history were reviewed and updated as appropriate: allergies, current medications, past family history, past medical history, past social history, past surgical history and problem list.  ? ?Health Maintenance:  Normal pap on 06/12/20. HPV positive, routine follow up recommended at age 82.  ? ?Review of Systems:  ?Pertinent items noted in HPI and remainder of comprehensive ROS otherwise negative. ? ?Physical Exam:  ? ?General:  Alert, oriented and cooperative. Patient  appears to be in no acute distress.  ?Mental Status: Normal mood and affect. Normal behavior. Normal judgment and thought content.   ?Respiratory: Normal respiratory effort, no problems with respiration noted  ?Rest of physical exam deferred due to type of encounter ? ?Labs and Imaging ?No results found for this or any previous visit (from the past 336 hour(s)). ?No results found.   ?  ?Assessment and Plan:  ?   ?1. Postpartum anxiety ?--Continue Buspar TID PRN ?--Start 5 mg daily x 1 week, then 10 mg daily ?--Establish care after she moves out of state ?--F/U with me in 1 month ? ?- escitalopram (LEXAPRO) 5 MG tablet; Take 1 tablet (5 mg total) by mouth daily for 7 days.  Dispense: 7 tablet; Refill: 0 ?- escitalopram (LEXAPRO) 10 MG tablet; Take 1 tablet (10 mg total) by mouth daily.  Dispense: 30 tablet; Refill: 5  ?   ?  ?I discussed the assessment and treatment plan with the patient. The patient was provided an opportunity to ask questions and all were answered. The patient agreed with the plan and demonstrated an understanding of the instructions. ?  ?The patient was advised to call back or seek an in-person evaluation/go to the ED if the symptoms worsen or if the condition fails to improve as anticipated. ? ?I provided 10 minutes of face-to-face time during this encounter. ? ? ?Sharen Counter, CNM ?[facility] [employer], [facility]  ?

## 2021-05-07 ENCOUNTER — Telehealth (INDEPENDENT_AMBULATORY_CARE_PROVIDER_SITE_OTHER): Payer: BC Managed Care – PPO | Admitting: Advanced Practice Midwife

## 2021-05-07 DIAGNOSIS — F418 Other specified anxiety disorders: Secondary | ICD-10-CM

## 2021-05-07 DIAGNOSIS — O99345 Other mental disorders complicating the puerperium: Secondary | ICD-10-CM

## 2021-05-07 MED ORDER — ESCITALOPRAM OXALATE 10 MG PO TABS
20.0000 mg | ORAL_TABLET | Freq: Every day | ORAL | 11 refills | Status: AC
Start: 2021-05-07 — End: ?

## 2021-05-07 NOTE — Progress Notes (Signed)
I connected with  Kelsey Dougherty on 05/07/21 by a video enabled telemedicine application and verified that I am speaking with the correct person using two identifiers. ?  ?I discussed the limitations of evaluation and management by telemedicine. The patient expressed understanding and agreed to proceed.  ? ?[facility] ?

## 2021-05-07 NOTE — Progress Notes (Signed)
? ? ?GYNECOLOGY VIRTUAL VISIT ENCOUNTER NOTE ? ?Provider location: [facility] [place]  ? ?Patient location: Home ? ?I connected with [name] on 05/07/21 at  4:10 PM EDT by MyChart Video Encounter and verified that I am speaking with the correct person using two identifiers. ?  ?I discussed the limitations, risks, security and privacy concerns of performing an evaluation and management service virtually and the availability of in person appointments. I also discussed with the patient that there may be a patient responsible charge related to this service. The patient expressed understanding and agreed to proceed. ?  ?History:  ?Kelsey Dougherty is a 26 y.o. [phone] female being evaluated today for postpartum anxiety.  She was seen on 04/09/21 and started on Lexapro 10 mg daily after a 1 week initiation of 5 mg daily. She denies any side effects and reports a slight improvement in symptoms.  She does still report episodes of negative thoughts that are not resolved by the medication. She took Buspar one day this week for an episode in the car of thinking of worst case scenario of having a car accident.  The episode resolved, pt she is not sure if the Buspar helped, or the thoughts just went away after she was no longer in the car.   She denies any abnormal vaginal discharge, bleeding, pelvic pain or other gyn concerns.   ?  ?  ?Past Medical History:  ?Diagnosis Date  ? Anxiety   ? Depression   ? Recurrent cold sores   ? ?Past Surgical History:  ?Procedure Laterality Date  ? EYE SURGERY Right 2016  ? x3  ? RHINOPLASTY  2016  ? WISDOM TOOTH EXTRACTION  2017  ? ?The following portions of the patient's history were reviewed and updated as appropriate: allergies, current medications, past family history, past medical history, past social history, past surgical history and problem list.  ? ?Health Maintenance:  Pap wnl 06/12/20, repeat in 3 years per ASCCP guidelines.  ? ?Review of Systems:  ?Pertinent  items noted in HPI and remainder of comprehensive ROS otherwise negative. ? ?Physical Exam:  ? ?General:  Alert, oriented and cooperative. Patient appears to be in no acute distress.  ?Mental Status: Normal mood and affect. Normal behavior. Normal judgment and thought content.   ?Respiratory: Normal respiratory effort, no problems with respiration noted  ?Rest of physical exam deferred due to type of encounter ? ?Labs and Imaging ?No results found for this or any previous visit (from the past 336 hour(s)). ?No results found.   ?  ?Assessment and Plan:  ?   ?1. Postpartum anxiety ?--Medication helping but symptoms not improved enough.  Increase Lexapro to 20 mg daily, as pt has minimal side effects from 10 mg dose. ?--Encouraged pt to establish care with PCP and with behavioral health at her new location in [place] ? ?- escitalopram (LEXAPRO) 10 MG tablet; Take 2 tablets (20 mg total) by mouth daily.  Dispense: 60 tablet; Refill: 11  ?   ?  ?I discussed the assessment and treatment plan with the patient. The patient was provided an opportunity to ask questions and all were answered. The patient agreed with the plan and demonstrated an understanding of the instructions. ?  ?The patient was advised to call back or seek an in-person evaluation/go to the ED if the symptoms worsen or if the condition fails to improve as anticipated. ? ?I provided 10 minutes of face-to-face time during this encounter. ? ? ?Sharen Counter,  CNM ?[facility] [employer], [facility]  ?

## 2022-12-02 IMAGING — DX DG FINGER RING 2+V*R*
3 series · 3 of 3 positions shown · non-contrast
Comparison: None.

CLINICAL DATA: Dog bite to distal finger

EXAM:
RIGHT RING FINGER 2+V

[finger ap]
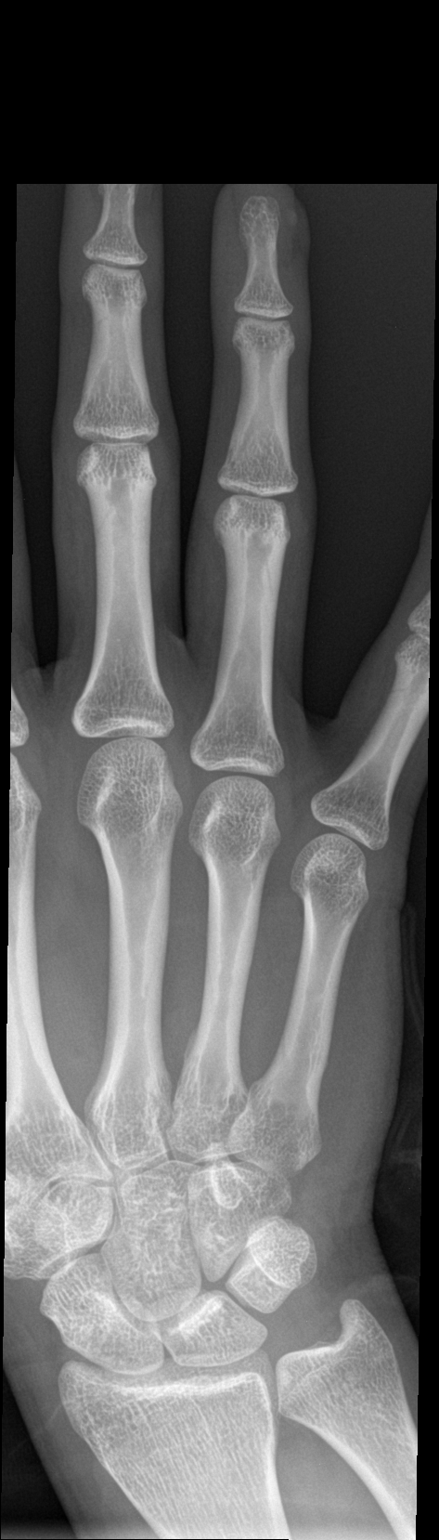

[finger obl]
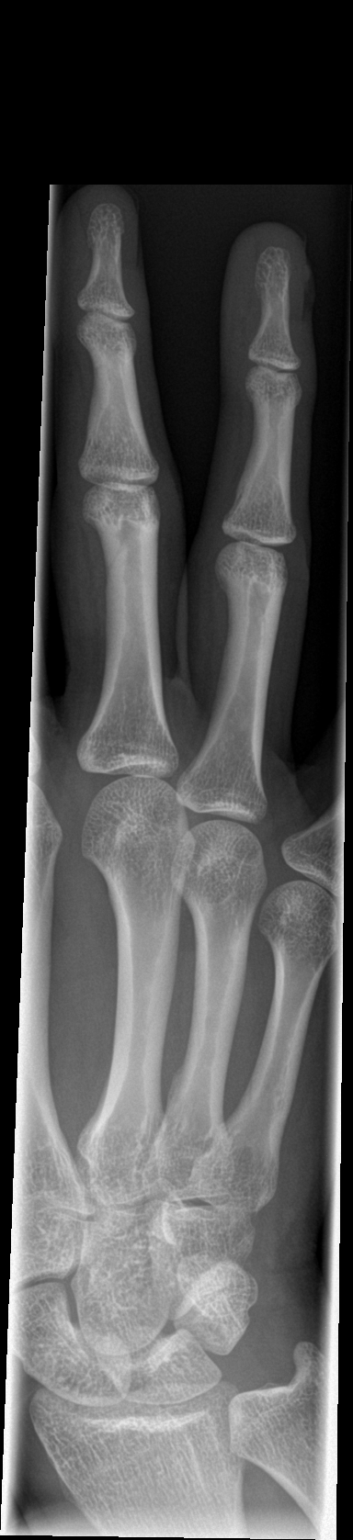

[finger lat]
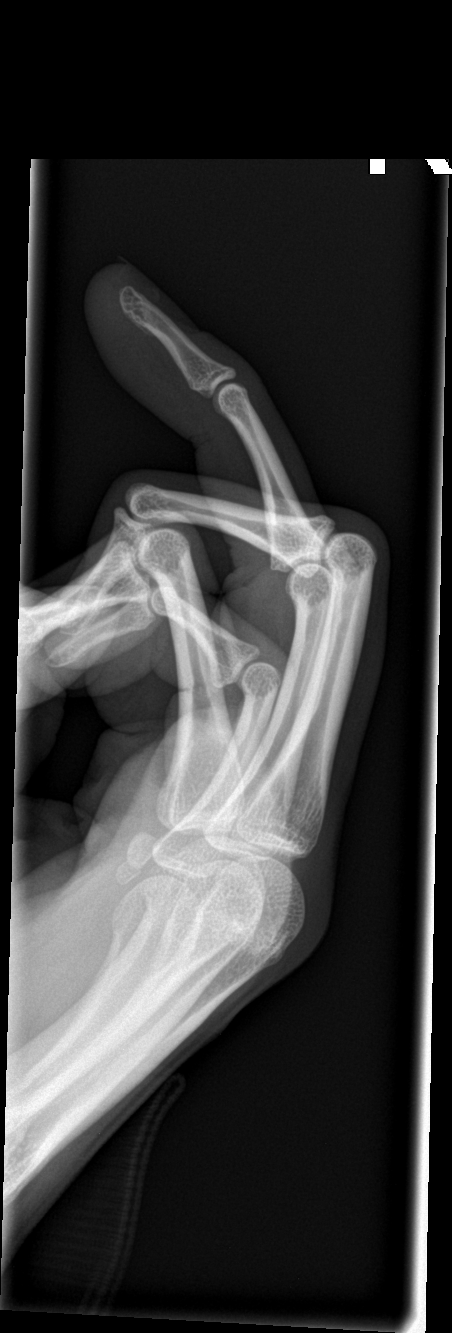

[3 of 3 positions shown; findings below may reference images not displayed]

FINDINGS: No fracture or dislocation is seen.

The joint spaces are preserved.

Soft tissue swelling/laceration along the medial aspect of the 4th
distal phalanx.

No radiopaque foreign body is seen.
IMPRESSION: Soft tissue swelling/laceration along the medial aspect of the 4th
distal phalanx.

No fracture, dislocation, or radiopaque foreign body is seen.
